# Patient Record
Sex: Female | Born: 1965
Health system: Southern US, Community
[De-identification: ages and names within clinical notes are randomized; demographics above are authoritative.]

## PROBLEM LIST (undated history)

## (undated) DIAGNOSIS — F32A Depression, unspecified: Secondary | ICD-10-CM

## (undated) DIAGNOSIS — Z800 Family history of malignant neoplasm of digestive organs: Secondary | ICD-10-CM

## (undated) DIAGNOSIS — F329 Major depressive disorder, single episode, unspecified: Secondary | ICD-10-CM

## (undated) DIAGNOSIS — D219 Benign neoplasm of connective and other soft tissue, unspecified: Secondary | ICD-10-CM

## (undated) DIAGNOSIS — D649 Anemia, unspecified: Secondary | ICD-10-CM

## (undated) DIAGNOSIS — Z803 Family history of malignant neoplasm of breast: Secondary | ICD-10-CM

## (undated) DIAGNOSIS — Z8041 Family history of malignant neoplasm of ovary: Secondary | ICD-10-CM

## (undated) DIAGNOSIS — R519 Headache, unspecified: Secondary | ICD-10-CM

## (undated) DIAGNOSIS — T7840XA Allergy, unspecified, initial encounter: Secondary | ICD-10-CM

## (undated) DIAGNOSIS — E079 Disorder of thyroid, unspecified: Secondary | ICD-10-CM

## (undated) DIAGNOSIS — R51 Headache: Secondary | ICD-10-CM

## (undated) HISTORY — DX: Family history of malignant neoplasm of breast: Z80.3

## (undated) HISTORY — DX: Major depressive disorder, single episode, unspecified: F32.9

## (undated) HISTORY — DX: Family history of malignant neoplasm of digestive organs: Z80.0

## (undated) HISTORY — PX: WISDOM TOOTH EXTRACTION: SHX21

## (undated) HISTORY — DX: Depression, unspecified: F32.A

## (undated) HISTORY — DX: Allergy, unspecified, initial encounter: T78.40XA

## (undated) HISTORY — DX: Anemia, unspecified: D64.9

## (undated) HISTORY — PX: MYOMECTOMY: SHX85

## (undated) HISTORY — DX: Family history of malignant neoplasm of ovary: Z80.41

---

## 1998-08-17 ENCOUNTER — Other Ambulatory Visit: Admission: RE | Admit: 1998-08-17 | Discharge: 1998-08-17 | Payer: Self-pay | Admitting: Obstetrics and Gynecology

## 1999-01-07 ENCOUNTER — Ambulatory Visit (HOSPITAL_COMMUNITY): Admission: RE | Admit: 1999-01-07 | Discharge: 1999-01-07 | Payer: Self-pay | Admitting: Obstetrics and Gynecology

## 1999-01-07 ENCOUNTER — Encounter (INDEPENDENT_AMBULATORY_CARE_PROVIDER_SITE_OTHER): Payer: Self-pay

## 1999-10-22 ENCOUNTER — Other Ambulatory Visit: Admission: RE | Admit: 1999-10-22 | Discharge: 1999-10-22 | Payer: Self-pay | Admitting: Obstetrics and Gynecology

## 2001-02-02 ENCOUNTER — Other Ambulatory Visit: Admission: RE | Admit: 2001-02-02 | Discharge: 2001-02-02 | Payer: Self-pay | Admitting: Obstetrics and Gynecology

## 2001-04-27 ENCOUNTER — Encounter: Admission: RE | Admit: 2001-04-27 | Discharge: 2001-04-27 | Payer: Self-pay | Admitting: Obstetrics and Gynecology

## 2001-04-27 ENCOUNTER — Encounter: Payer: Self-pay | Admitting: Obstetrics and Gynecology

## 2001-11-05 ENCOUNTER — Other Ambulatory Visit: Admission: RE | Admit: 2001-11-05 | Discharge: 2001-11-05 | Payer: Self-pay | Admitting: Obstetrics and Gynecology

## 2003-12-09 ENCOUNTER — Other Ambulatory Visit: Admission: RE | Admit: 2003-12-09 | Discharge: 2003-12-09 | Payer: Self-pay | Admitting: Obstetrics and Gynecology

## 2005-05-11 ENCOUNTER — Ambulatory Visit (HOSPITAL_COMMUNITY): Admission: RE | Admit: 2005-05-11 | Discharge: 2005-05-11 | Payer: Self-pay | Admitting: Obstetrics and Gynecology

## 2006-01-16 ENCOUNTER — Emergency Department (HOSPITAL_COMMUNITY): Admission: EM | Admit: 2006-01-16 | Discharge: 2006-01-16 | Payer: Self-pay | Admitting: Emergency Medicine

## 2006-08-28 ENCOUNTER — Ambulatory Visit (HOSPITAL_COMMUNITY): Admission: RE | Admit: 2006-08-28 | Discharge: 2006-08-28 | Payer: Self-pay | Admitting: Obstetrics and Gynecology

## 2006-11-22 IMAGING — CR DG CHEST 2V
2 series · 2 of 2 positions shown · non-contrast
Comparison: None.

CLINICAL DATA: Chest pain and headache.

[w chest pa]
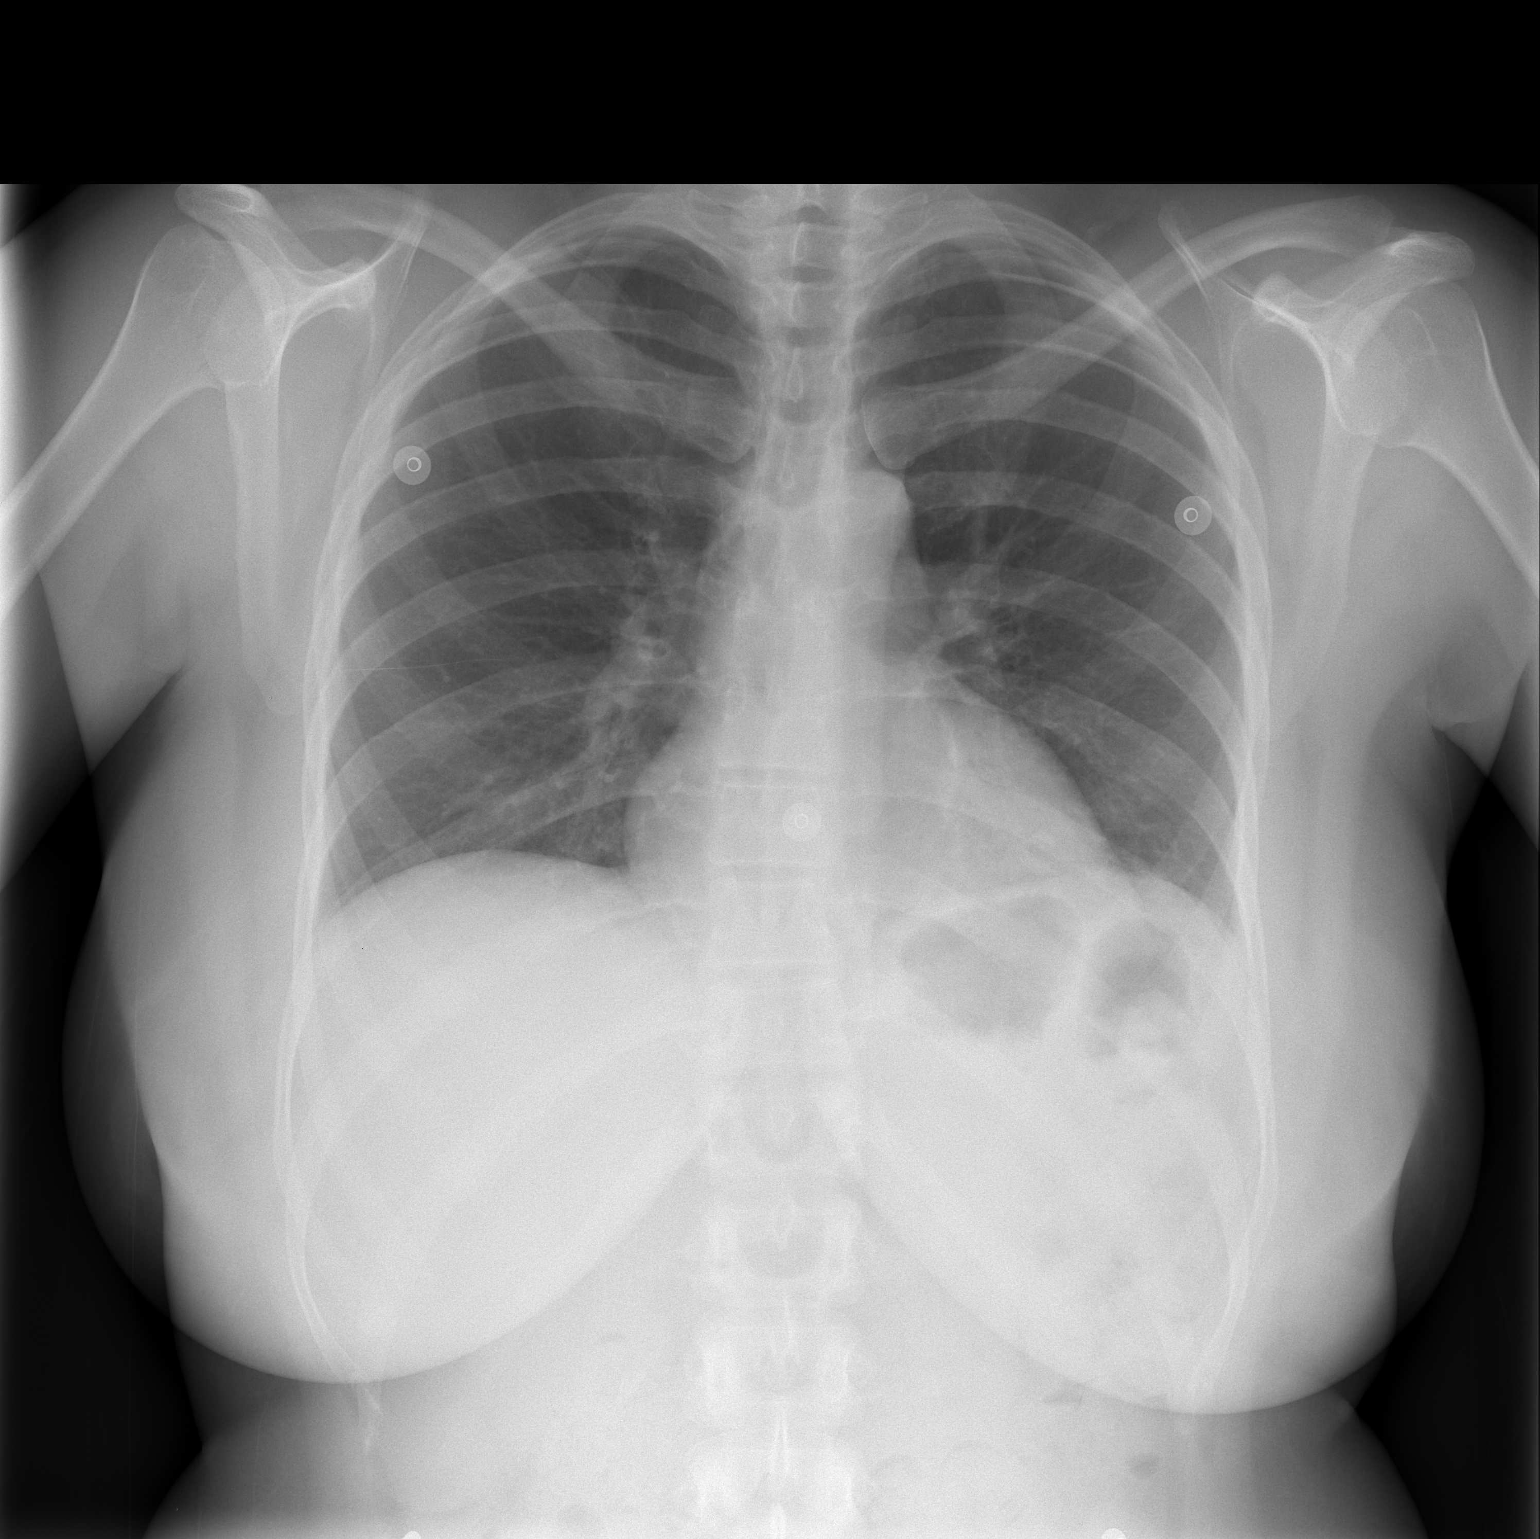

[w chest lat]
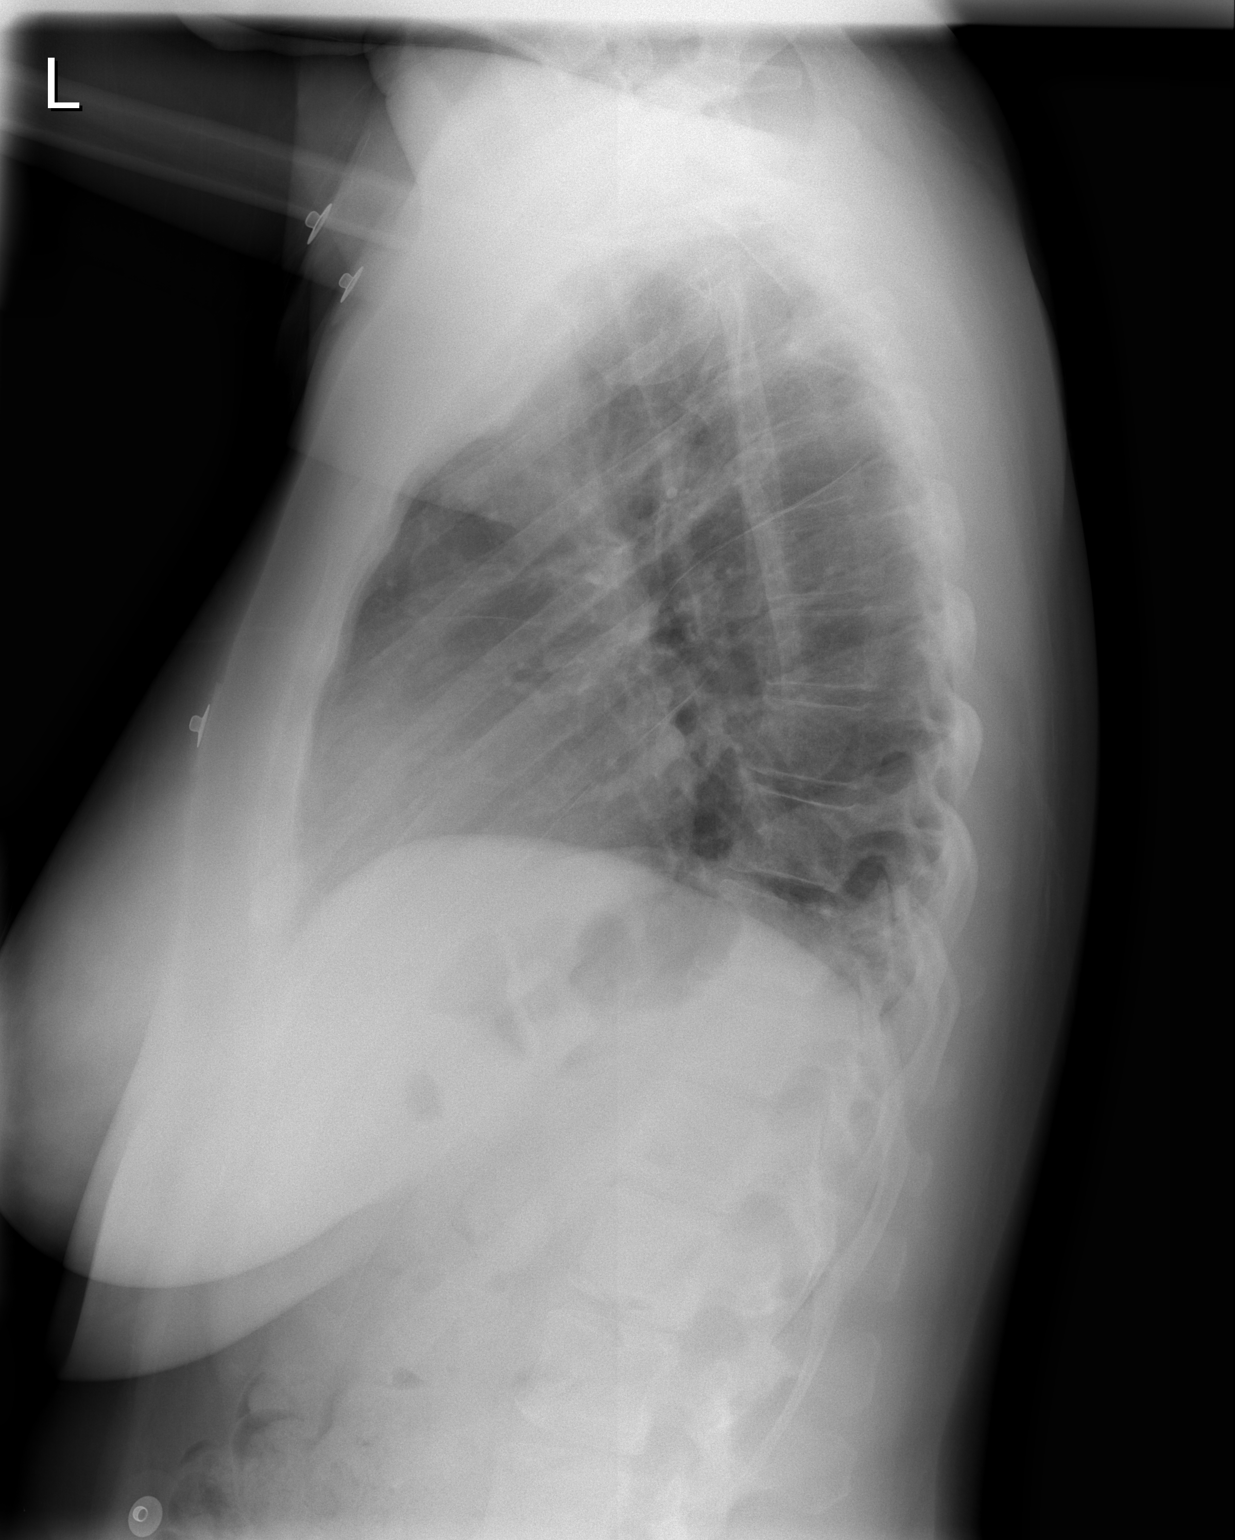

[2 of 2 positions shown; findings below may reference images not displayed]

CHEST - 2 VIEW:

Trace atelectasis at the left lung base. Right lung is clear. Heart size is
within normal limits. Bony structures of the visualized thorax are intact.
IMPRESSION: Trace atelectasis at the left lung base. An early infiltrate cannot be
completely excluded.

## 2007-01-17 ENCOUNTER — Inpatient Hospital Stay (HOSPITAL_COMMUNITY): Admission: RE | Admit: 2007-01-17 | Discharge: 2007-01-19 | Payer: Self-pay | Admitting: Obstetrics and Gynecology

## 2007-01-17 ENCOUNTER — Encounter (INDEPENDENT_AMBULATORY_CARE_PROVIDER_SITE_OTHER): Payer: Self-pay | Admitting: Obstetrics and Gynecology

## 2008-11-04 ENCOUNTER — Ambulatory Visit (HOSPITAL_COMMUNITY): Admission: RE | Admit: 2008-11-04 | Discharge: 2008-11-04 | Payer: Self-pay | Admitting: Obstetrics and Gynecology

## 2009-01-17 ENCOUNTER — Encounter: Admission: RE | Admit: 2009-01-17 | Discharge: 2009-01-17 | Payer: Self-pay | Admitting: Rheumatology

## 2009-08-27 ENCOUNTER — Ambulatory Visit: Payer: Self-pay | Admitting: Gastroenterology

## 2009-11-04 ENCOUNTER — Emergency Department (HOSPITAL_COMMUNITY): Admission: EM | Admit: 2009-11-04 | Discharge: 2009-11-04 | Payer: Self-pay | Admitting: Emergency Medicine

## 2010-01-12 ENCOUNTER — Ambulatory Visit (HOSPITAL_COMMUNITY): Admission: RE | Admit: 2010-01-12 | Discharge: 2010-01-12 | Payer: Self-pay | Admitting: Obstetrics and Gynecology

## 2010-01-14 ENCOUNTER — Encounter: Admission: RE | Admit: 2010-01-14 | Discharge: 2010-01-14 | Payer: Self-pay | Admitting: Obstetrics and Gynecology

## 2010-03-11 ENCOUNTER — Ambulatory Visit: Payer: Self-pay | Admitting: Family Medicine

## 2010-03-11 DIAGNOSIS — K219 Gastro-esophageal reflux disease without esophagitis: Secondary | ICD-10-CM | POA: Insufficient documentation

## 2010-03-11 DIAGNOSIS — F3289 Other specified depressive episodes: Secondary | ICD-10-CM | POA: Insufficient documentation

## 2010-03-11 DIAGNOSIS — F329 Major depressive disorder, single episode, unspecified: Secondary | ICD-10-CM | POA: Insufficient documentation

## 2010-03-11 DIAGNOSIS — F4321 Adjustment disorder with depressed mood: Secondary | ICD-10-CM | POA: Insufficient documentation

## 2010-03-11 DIAGNOSIS — E039 Hypothyroidism, unspecified: Secondary | ICD-10-CM | POA: Insufficient documentation

## 2010-03-11 DIAGNOSIS — G43909 Migraine, unspecified, not intractable, without status migrainosus: Secondary | ICD-10-CM | POA: Insufficient documentation

## 2010-03-11 DIAGNOSIS — J4520 Mild intermittent asthma, uncomplicated: Secondary | ICD-10-CM | POA: Insufficient documentation

## 2010-03-11 DIAGNOSIS — D649 Anemia, unspecified: Secondary | ICD-10-CM | POA: Insufficient documentation

## 2010-03-11 DIAGNOSIS — J45909 Unspecified asthma, uncomplicated: Secondary | ICD-10-CM | POA: Insufficient documentation

## 2010-07-12 ENCOUNTER — Encounter: Payer: Self-pay | Admitting: Rheumatology

## 2010-07-22 NOTE — Letter (Signed)
Summary: Patient Questionnaire  Patient Questionnaire   Imported By: Beau Fanny 03/11/2010 14:48:35  _____________________________________________________________________  External Attachment:    Type:   Image     Comment:   External Document

## 2010-07-22 NOTE — Assessment & Plan Note (Signed)
Summary: NEW PT TO EST/TICK BITE/CLE   Vital Signs:  Patient profile:   45 year old female Height:      66 inches Weight:      195.25 pounds BMI:     31.63 Temp:     98.1 degrees F oral Pulse rate:   76 / minute Pulse rhythm:   regular BP sitting:   100 / 72  (left arm) Cuff size:   large  Vitals Entered By: Lewanda Rife LPN (04/01/10 11:22 AM) CC: New pt to establish and tick removed from lt heel about one month ago, site still itches and is swollen   History of Present Illness: sees Dr Corine Shelter at [facility] ) in past -- and he is leaving ? but she will see him for thyroid problem until them was a holistic physician and MD  tx her for thyroid and edema issues   used to have anemia from fibroids - better after myomectomy  asthma started when she was 31  uses inhaler -- rescue when she needs it  pain will trigger it along with strong scents  also has seasonal allergies   depression -- due to hormone problems and stress and thyroid  low seretonin -- on thyroid med and progesterone now  was on antidep for 6 months - celexa (no longer on it )  saw a psychiatrist and a counselor in the past   GERD - occasionally -- takes prilosec occ - 1-2 per year  watches diet   migraine history  went to hosp 3-4 mo ago with one  has them a few times per year  usually takes excedrin migraine   has hypothyroidism  no hx of hyperthyroid - or treatment  no hx of goiter or thyroid cysts or nodules her mother has it     Dr Estanislado Pandy sees her gets regular periods with some cramps -myomectomy helped also endometriosis  last visit with Dr Estanislado Pandy last week  -- had dx of herpes  - oral and genital area -- flare ups tend to be aching instead of rash not on any med yet - considering valtrex  is not sexually active   needs Td needs flu shot   will due for labs in nov   had a tick bite 1 month ago (her mom had one too ) swelled and turned back -- still itches / little black  shape no target rash was a deer tick   other bite -- ? spider 2 mo ago  woke up with little blisters wrist and other hand in several areas / leg  they swelled up and then caved in  wondered about brown recluse bite  a little hypopigmentation at site on leg   Preventive Screening-Counseling & Management  Alcohol-Tobacco     Smoking Status: never  Caffeine-Diet-Exercise     Does Patient Exercise: yes      Drug Use:  no.    Allergies (verified): 1)  ! 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 Single Never Smoked Alcohol use-yes  -- 3 drinks a month  Drug use-no Regular exercise-yes- spinning classes   Risk Factors: Exercise: yes (04/01/2010)  Risk Factors: Smoking Status: never (01-Apr-2010)  Past Medical History: Anemia-NOS Asthma- mild  seasonal allergies- no meds  Depression GERD Migraines Thyroid problem HSV - labial and genital (possibly type 1 and 2)  fibrocystic breasts  IBS  hx of "colon infection"  Past Surgical History: Myomectomy for fibroids 2008 Laproscopy endometriosis  Family History: Father: deceased alcoholism/drug problem, copd  Mother: living arthiritis,breast cancer,stroke, hypothyroid  paternal grandmother:arthritis aunt: breast cancer mother is Medical illustrator  Social History: Medical sales representative- works for [employer]  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 Eyes:  Denies blurring and eye irritation. CV:  Denies chest pain or discomfort, lightheadness, and palpitations. Resp:  Denies cough, pleuritic, shortness of breath, and wheezing; occ pain  at L lower rib when she takes a deep breath . GI:  Denies abdominal pain, change in bowel habits, indigestion, and nausea. GU:  Denies discharge and dysuria. MS:  Complains of joint pain; denies joint swelling; R hip hurts sometimes in the groin area . Derm:  Complains of insect bite(s); denies itching, lesion(s), poor wound healing, and rash. Neuro:  Complains of headaches; denies numbness, sensation of room spinning, and visual disturbances. Psych:  Denies anxiety, irritability, and panic attacks. Endo:  Denies cold intolerance, excessive thirst, excessive urination, and heat intolerance. Heme:  Denies abnormal bruising and bleeding.  Physical Exam  General:  overweight but generally well appearing  Head:  normocephalic, atraumatic, and no abnormalities observed.   Eyes:  vision grossly intact, pupils equal, pupils round, and pupils reactive to light.  no conjunctival pallor, injection or icterus  Nose:  no nasal discharge.   Mouth:  pharynx pink and moist.   Neck:  supple with full rom and no masses or thyromegally, no JVD or carotid bruit  Chest Wall:  No deformities, masses, or tenderness noted. Lungs:  Normal respiratory effort, chest expands symmetrically. Lungs are clear to auscultation, no crackles or wheezes. Heart:  Normal rate and regular rhythm. S1 and S2 normal without gallop, murmur, click, rub or other extra sounds. Abdomen:  Bowel sounds positive,abdomen soft and non-tender without masses, organomegaly or hernias noted. Msk:  some pain on ext rot of L hip without creptice nl slr and gait  no acute joint changes  Pulses:  R and L carotid,radial,femoral,dorsalis  pedis and posterior tibial pulses are full and equal bilaterally Extremities:  No clubbing, cyanosis, edema, or deformity noted with normal full range of motion of all joints.   Neurologic:  sensation intact to light touch, gait normal, and DTRs symmetrical and normal.   Skin:  Intact without suspicious lesions or rashes Cervical Nodes:  No lymphadenopathy noted Inguinal Nodes:  No significant adenopathy Psych:  normal affect, talkative and pleasant    Impression & Recommendations:  Problem # 1:  UNSPECIFIED HYPOTHYROIDISM (ICD-244.9) Assessment New will continue to see her specialist/ holistic dr for this and progesterone until he retires/ leaves she is feeling about the same will have labs in nov and foward them to me  Her updated medication list for this problem includes:    Armour Thyroid 30 Mg Tabs (Thyroid) .Marland Kitchen... Take 1 tablet by mouth once a day  Problem # 2:  Preventive Health Care (ICD-V70.0) Assessment: Comment Only Td  today will get flu shot at work  Problem # 3:  TICK BITE (ICD-E906.4) Assessment: New 1 mo ago- no rel symptoms small hyperpig area on L ankle remains  will continue to watch for rash / joint pain or other symptoms  Complete Medication List: 1)  Armour Thyroid 30 Mg Tabs (Thyroid) .... Take 1 tablet by mouth once a day 2)  Fish Oil Oil (Fish oil) .... Otc as directed. 3)  Progesterone ?mg  .... Two at bedtime 4)  Ventolin Hfa 108 (90 Base) Mcg/act Aers (Albuterol sulfate) .... 2 puffs up to every 4 hours as needed  Other Orders: TD Toxoids IM 7 YR + (16109) Admin 1st Vaccine (60454)  Patient Instructions: 1)  if hip and rib pain do not improve with heat and stretching in 1 month let me know  2)  please send copy of herbs  3)  tetnus shot today 4)  ger your flu shot at work 5)  please send me a copy of labs from november 6)  update me if rash/ joint pain - signs of tick fever   Current Allergies (reviewed today): ! CODEINE   Preventive Care  Screening     colonoscopy in 2008- in [place]    Immunizations Administered:  Tetanus Vaccine:    Vaccine Type: Td    Site: left deltoid    Mfr: Sanofi Pasteur    Dose: 0.5 ml    Route: IM    Given by: Lewanda Rife LPN    Exp. Date: 07/22/2011    Lot #: [id number]    VIS given: 05/07/08 version given March 11, 2010.  Pt decided she can get the flu shot at work so she will wait on the flu shot today.Lewanda Rife LPN  March 11, 2010 1:03 PM

## 2010-09-06 LAB — URINALYSIS, ROUTINE W REFLEX MICROSCOPIC
Ketones, ur: NEGATIVE mg/dL
Leukocytes, UA: NEGATIVE
Nitrite: NEGATIVE
Urobilinogen, UA: 0.2 mg/dL (ref 0.0–1.0)

## 2010-09-06 LAB — POCT PREGNANCY, URINE: Preg Test, Ur: NEGATIVE

## 2010-09-06 LAB — DIFFERENTIAL
Basophils Absolute: 0 10*3/uL (ref 0.0–0.1)
Eosinophils Absolute: 0.1 10*3/uL (ref 0.0–0.7)
Lymphocytes Relative: 31 % (ref 12–46)
Neutro Abs: 3.8 10*3/uL (ref 1.7–7.7)

## 2010-09-06 LAB — CBC
Hemoglobin: 13.8 g/dL (ref 12.0–15.0)
MCHC: 33.9 g/dL (ref 30.0–36.0)
MCV: 90.8 fL (ref 78.0–100.0)
RBC: 4.48 MIL/uL (ref 3.87–5.11)
RDW: 13.4 % (ref 11.5–15.5)
WBC: 6.2 10*3/uL (ref 4.0–10.5)

## 2010-09-06 LAB — URINE MICROSCOPIC-ADD ON

## 2010-09-06 LAB — POCT I-STAT, CHEM 8
BUN: 10 mg/dL (ref 6–23)
Glucose, Bld: 77 mg/dL (ref 70–99)
Hemoglobin: 15 g/dL (ref 12.0–15.0)

## 2010-09-15 ENCOUNTER — Encounter: Payer: Self-pay | Admitting: Family Medicine

## 2010-09-16 ENCOUNTER — Encounter: Payer: Self-pay | Admitting: Family Medicine

## 2010-11-02 NOTE — Op Note (Signed)
[name], [name]               ACCOUNT NO.:  [id number]   MEDICAL RECORD NO.:  [id number]          PATIENT TYPE:  INP   LOCATION:  9305                          FACILITY:  [facility]   PHYSICIAN:  Maxie Better, M.D.DATE OF BIRTH:  1965/12/16   DATE OF PROCEDURE:  01/17/2007  DATE OF DISCHARGE:                               OPERATIVE REPORT   PREOPERATIVE DIAGNOSIS:  Menorrhagia, uterine fibroids.   PROCEDURE:  Exploratory laparotomy, multiple myomectomy.   POSTOPERATIVE DIAGNOSIS:  Menorrhagia, uterine fibroids.   ANESTHESIA:  General.   SURGEON:  Maxie Better, M.D.   ASSISTANT SURGEON:  Sherry A. Rosalio Macadamia, M.D.   PROCEDURE:  Under adequate general anesthesia, the patient is placed in  the supine position.  She was sterilely prepped and draped in usual  fashion.  Indwelling Foley catheter was sterilely placed.  Examination  under anesthesia had revealed about 12-week size uterus.  No adnexal  masses could be appreciated.  A Pfannenstiel skin incision was made.  Quarter percent Marcaine was injected prior to the incision.  This  incision was carried down to the rectus fascia.  Rectus fascia was  opened transversely.  The rectus fascia was then bluntly and sharply  dissected off the rectus muscle in superior and inferior fashion.  The  rectus muscles split in midline.  The parietal peritoneum was entered  sharply and extended.  The uterus was noted to have multiple fibroids.  In particular there was a prominent subserosal fibroid posteriorly and  multiple anterior fibroids, two of whom were at the fundal area medial  to the fallopian tube insertions bilaterally.  Anteriorly there was  several subserosal fibroids above the bladder reflection, a Balfour  retractor was placed.  The bowels were packed upwardly.  Normal tubes  and ovaries were noted bilaterally.  In the posterior cul-de-sac deep in  the pelvis there were two small endometriotic implants seen.  A dilute  solution of Pitressin was injected overlying fibroids and the  enucleation of the fibroids were then made starting on the fundus and  anteriorly.  Posteriorly a vertical posterior incision had to be made  and through that incision several fibroids were removed and in  particular the endometrial cavity was there at that point entered and  exploration with the digital finger the multiple fibroids were noted in  the endometrial cavity some of which was pedunculated.  Others were  submucosal.  These removed anteriorly through the anterior incision that  had been made where several fibroids were removed.  A degenerating  fibroid was found deep which had been transversing not only the  intramural area but extending to the submucosal area.  This was  carefully dissected all of which were close to the insertion of the  fallopian tubes care was taken not to involve those organs.  When all  palpable fibroids had been removed both anteriorly and posteriorly,  these incisions were then closed with 0-0 Vicryl figure-of-eight sutures  to serosal surface which was then closed with 4-0 Monocryl sutures in a  baseball stitch.  Several reinforcing sutures were placed for good  hemostasis.  The  uterus was then irrigated, the pelvis irrigated and  suctioned of debris.  The incisions were then covered with Interceed  anteriorly and posteriorly.  No other findings were noted.  Good  hemostasis was noted.  The parietal peritoneum was not closed.  The  packings were removed.  The retractors were removed and the rectus  fascia was then closed with 0-0 Vicryl x2.  The subcutaneous area was  irrigated, small bleeders cauterized.  Interrupted 2-0 plain sutures and  skin approximated using Ethicon staples.  Specimen was myoma x 15.  Estimated blood loss was 150 mL.  Sponge and instrument counts x2 was  correct.  The intraoperative fluid was about 1500 mL crystalloid.  Urine  output was 200 mL clear yellow urine.  The  endometrial cavity had been  entered and therefore this patient will need cesarean section for future  deliveries.   COMPLICATIONS:  None.   The patient tolerated the procedure well, was transferred to recovery in  stable condition.      Maxie Better, M.D.  Electronically Signed     [place]/MEDQ  D:  01/17/2007  T:  01/18/2007  Job:  [id number]

## 2010-11-05 NOTE — Discharge Summary (Signed)
[name], [name]               ACCOUNT NO.:  [id number]   MEDICAL RECORD NO.:  [id number]          PATIENT TYPE:  INP   LOCATION:  9305                          FACILITY:  [facility]   PHYSICIAN:  Maxie Better, M.D.DATE OF BIRTH:  09/01/1965   DATE OF ADMISSION:  01/17/2007  DATE OF DISCHARGE:  01/19/2007                               DISCHARGE SUMMARY   ADMISSION DIAGNOSES:  Menorrhagia, uterine fibroids, iron deficiency  anemia.   DISCHARGE DIAGNOSES:  Menorrhagia, uterine fibroids, iron-deficiency  anemia.   PROCEDURE:  Exploratory laparotomy, multiple myomectomy.   HOSPITAL COURSE:  The patient was admitted to [facility] and she  underwent an exploratory laparotomy and myomectomy. Normal tubes and  ovaries were noted at the time. Endometrial cavity was entered which  will necessitate Cesarean sections in the future.  Pathology was  consistent with 15 leiomyomas one of which was infarcted  clinically.  The patient had slow postoperative course due to her bowels with the  patient subsequently passing gas later in the evening of postop day #2.   Her CBC on postop day #1 showed a hemoglobin of 9.6, hematocrit of 29.6,  white count 11.7, platelet count of greater than 319,000.  The preop  hemoglobin had been 10.9.  After the patient passed flatus, had been  tolerating a regular diet, had remained afebrile and incision showed no  evidence of infection, she was deemed well to be discharged home.   DISPOSITION:  Home.   CONDITION ON DISCHARGE:  Stable.   DISCHARGE MEDICATIONS:  1. Motrin 800 mg every 6 h p.r.n., pain.  2. Tylox 1 to 2 tablets every 3 to 4 h p.r.n., pain.   DISCHARGE INSTRUCTIONS:  Call for temperature greater than or equal to  100.4.  Nothing per vagina for 4 to 6 weeks. No heavy lifting or driving  for 2 weeks. Call for increased incisional pain, drainage, redness at  the incision site. Call for severe abdominal pain, nausea, vomiting.  Follow up at  [facility] at 4 to 6 weeks and for staple removal in  the office on Monday or Tuesday.      Maxie Better, M.D.  Electronically Signed     [place]/MEDQ  D:  02/25/2007  T:  02/25/2007  Job:  [id number]

## 2011-04-04 LAB — CBC
HCT: 29.6 — ABNORMAL LOW
Hemoglobin: 10.9 — ABNORMAL LOW
MCV: 85.7
Platelets: 319
Platelets: 371
RDW: 18.4 — ABNORMAL HIGH
RDW: 19.2 — ABNORMAL HIGH
WBC: 11.7 — ABNORMAL HIGH

## 2011-04-04 LAB — HCG, SERUM, QUALITATIVE: Preg, Serum: NEGATIVE

## 2011-05-17 ENCOUNTER — Other Ambulatory Visit: Payer: Self-pay | Admitting: Physician Assistant

## 2011-05-17 DIAGNOSIS — Z1231 Encounter for screening mammogram for malignant neoplasm of breast: Secondary | ICD-10-CM

## 2011-06-23 ENCOUNTER — Ambulatory Visit (HOSPITAL_COMMUNITY): Payer: Self-pay

## 2011-07-21 ENCOUNTER — Ambulatory Visit (HOSPITAL_COMMUNITY)
Admission: RE | Admit: 2011-07-21 | Discharge: 2011-07-21 | Disposition: A | Discharge status: Home / Self Care | Payer: Federal, State, Local not specified - PPO | Source: Ambulatory Visit | Attending: Physician Assistant | Admitting: Physician Assistant

## 2011-07-21 DIAGNOSIS — Z1231 Encounter for screening mammogram for malignant neoplasm of breast: Secondary | ICD-10-CM

## 2011-09-14 ENCOUNTER — Other Ambulatory Visit: Payer: Self-pay | Admitting: Nurse Practitioner

## 2011-09-20 ENCOUNTER — Other Ambulatory Visit: Payer: Federal, State, Local not specified - PPO

## 2011-09-21 ENCOUNTER — Other Ambulatory Visit: Payer: Federal, State, Local not specified - PPO

## 2011-09-27 ENCOUNTER — Ambulatory Visit
Admission: RE | Admit: 2011-09-27 | Discharge: 2011-09-27 | Disposition: A | Discharge status: Home / Self Care | Payer: Federal, State, Local not specified - PPO | Source: Ambulatory Visit | Attending: Nurse Practitioner | Admitting: Nurse Practitioner

## 2011-12-21 ENCOUNTER — Other Ambulatory Visit: Payer: Self-pay | Admitting: Obstetrics and Gynecology

## 2012-03-14 ENCOUNTER — Encounter (HOSPITAL_BASED_OUTPATIENT_CLINIC_OR_DEPARTMENT_OTHER): Payer: Self-pay

## 2012-03-14 ENCOUNTER — Emergency Department (HOSPITAL_BASED_OUTPATIENT_CLINIC_OR_DEPARTMENT_OTHER): Payer: Federal, State, Local not specified - PPO

## 2012-03-14 ENCOUNTER — Emergency Department (HOSPITAL_BASED_OUTPATIENT_CLINIC_OR_DEPARTMENT_OTHER)
Admission: EM | Admit: 2012-03-14 | Discharge: 2012-03-15 | Disposition: A | Discharge status: Home / Self Care | Payer: Federal, State, Local not specified - PPO | Attending: Emergency Medicine | Admitting: Emergency Medicine

## 2012-03-14 DIAGNOSIS — D259 Leiomyoma of uterus, unspecified: Secondary | ICD-10-CM | POA: Insufficient documentation

## 2012-03-14 DIAGNOSIS — E079 Disorder of thyroid, unspecified: Secondary | ICD-10-CM | POA: Insufficient documentation

## 2012-03-14 DIAGNOSIS — N83209 Unspecified ovarian cyst, unspecified side: Secondary | ICD-10-CM

## 2012-03-14 HISTORY — DX: Disorder of thyroid, unspecified: E07.9

## 2012-03-14 LAB — URINALYSIS, ROUTINE W REFLEX MICROSCOPIC
Ketones, ur: 15 mg/dL — AB
Protein, ur: NEGATIVE mg/dL
Urobilinogen, UA: 0.2 mg/dL (ref 0.0–1.0)
pH: 6.5 (ref 5.0–8.0)

## 2012-03-14 LAB — URINE MICROSCOPIC-ADD ON

## 2012-03-14 NOTE — ED Notes (Signed)
Patient here with LLQ pain since 4pm with vomiting. Denies diarrhea....denies urinary symptoms

## 2012-03-14 NOTE — ED Notes (Signed)
MD at bedside. 

## 2012-03-15 MED ORDER — IBUPROFEN 800 MG PO TABS
800.0000 mg | ORAL_TABLET | Freq: Once | ORAL | Status: AC
  Administered 2012-03-15: 800 mg via ORAL
  Filled 2012-03-15: qty 1

## 2012-03-15 MED ORDER — HYDROCODONE-ACETAMINOPHEN 5-500 MG PO TABS: 1.0000 | ORAL_TABLET | Freq: Four times a day (QID) | ORAL | Status: DC | PRN

## 2012-03-15 MED ORDER — IBUPROFEN 600 MG PO TABS: 600.0000 mg | ORAL_TABLET | Freq: Four times a day (QID) | ORAL | Status: DC | PRN

## 2012-03-15 NOTE — ED Provider Notes (Signed)
History     CSN: [id number]  Arrival date & time 03/14/12  2228   First MD Initiated Contact with Patient 03/14/12 2251      Chief Complaint  Patient presents with  . Abdominal Pain    (Consider location/radiation/quality/duration/timing/severity/associated sxs/prior treatment) HPI Hx per PT. This afternoon started having L sided pelvic pain, has h/o uterine fibroids requiring myomectomy in the past. No F/C, no vag bleeding or discharge. Pain is constant. No known aggrevating or alleviating factors. Sharp in quality and not radiating. Moderate in severity, declines anything stronger than motrin. Feels similar to uterine fibroid pain that she had in the past. GYN is in [facility]. Past Medical History  Diagnosis Date  . Thyroid disease     History reviewed. No pertinent past surgical history.  No family history on file.  History  Substance Use Topics  . Smoking status: Never Smoker   . Smokeless tobacco: Not on file  . Alcohol Use:     OB History    Grav Para Term Preterm Abortions TAB SAB Ect Mult Living                  Review of Systems  Constitutional: Negative for fever and chills.  HENT: Negative for neck pain and neck stiffness.   Eyes: Negative for pain.  Respiratory: Negative for shortness of breath.   Cardiovascular: Negative for chest pain.  Gastrointestinal: Negative for abdominal pain.  Genitourinary: Positive for pelvic pain. Negative for dysuria, urgency, frequency, hematuria, vaginal bleeding and vaginal discharge.  Musculoskeletal: Negative for back pain.  Skin: Negative for rash.  Neurological: Negative for headaches.  All other systems reviewed and are negative.    Allergies  Codeine  Home Medications   Current Outpatient Rx  Name Route Sig Dispense Refill  . THYROID 60 MG PO TABS Oral Take 60 mg by mouth daily.      BP 128/78  Pulse 73  Temp 98.5 F (36.9 C) (Oral)  Resp 18  SpO2 98%  LMP 03/02/2012  Physical Exam  Nursing  note and vitals reviewed. Constitutional: She is oriented to person, place, and time. She appears well-developed and well-nourished.  HENT:  Head: Normocephalic and atraumatic.  Eyes: EOM are normal. Pupils are equal, round, and reactive to light. No scleral icterus.  Neck: Neck supple.  Cardiovascular: Normal heart sounds and intact distal pulses.   Pulmonary/Chest: Effort normal. No respiratory distress.  Abdominal: Soft. Bowel sounds are normal. She exhibits no distension and no mass. There is no rebound and no guarding.       TTP left lower pelvic without peritonitis, no tenderness otherwise.   Musculoskeletal: Normal range of motion. She exhibits no edema.  Neurological: She is alert and oriented to person, place, and time.  Skin: Skin is warm and dry.    ED Course  Procedures (including critical care time)  Results for orders placed during the hospital encounter of 03/14/12  URINALYSIS, ROUTINE W REFLEX MICROSCOPIC      Component Value Range   Color, Urine YELLOW  YELLOW   APPearance CLOUDY (*) CLEAR   Specific Gravity, Urine 1.017  1.005 - 1.030   pH 6.5  5.0 - 8.0   Glucose, UA NEGATIVE  NEGATIVE mg/dL   Hgb urine dipstick NEGATIVE  NEGATIVE   Bilirubin Urine NEGATIVE  NEGATIVE   Ketones, ur 15 (*) NEGATIVE mg/dL   Protein, ur NEGATIVE  NEGATIVE mg/dL   Urobilinogen, UA 0.2  0.0 - 1.0 mg/dL  Nitrite NEGATIVE  NEGATIVE   Leukocytes, UA SMALL (*) NEGATIVE  URINE MICROSCOPIC-ADD ON      Component Value Range   Squamous Epithelial / LPF MANY (*) RARE   WBC, UA 7-10  <3 WBC/hpf   RBC / HPF 0-2  <3 RBC/hpf   Bacteria, UA MANY (*) RARE   US Transvaginal Non-ob  03/15/2012  *RADIOLOGY REPORT*  Clinical Data: Left lower quadrant pain.  Nausea and vomiting. History of fibroids.  TRANSABDOMINAL AND TRANSVAGINAL ULTRASOUND OF PELVIS Technique:  Both transabdominal and transvaginal ultrasound examinations of the pelvis were performed. Transabdominal technique was performed for  global imaging of the pelvis including uterus, ovaries, adnexal regions, and pelvic cul-de-sac.  It was necessary to proceed with endovaginal exam following the transabdominal exam to visualize the uterus and adnexa.  Comparison:  None  Findings:  Uterus: Anterior fundal fibroid is present.  Overall uterine measurements are 7.9 cm x 4.9 cm x 5.5 cm. The anterior fundal fibroid measures 41 mm x 25 mm x 39 mm.  Majority of the myometrial echotexture is within normal limits.  Endometrium: 8 mm, normal.  Right ovary:  37 mm x 24 mm x 23 mm with normal echotexture.  Left ovary: 62 mm x 47 mm x 15 mm.  There is a solid appearing left adnexal lesion (3.8 x 3.4 x 3.4 cm) without intravascular flow, most compatible with a hemorrhagic cyst.  Slightly increased through transmission.  Small areas of decreased echogenicity are present centrally.  Normal color flow to the surrounding ovarian tissue.  Other findings: Physiologic trace free fluid.  IMPRESSION: 1.  Fibroid uterus.  Largest anterior fundal fibroid measures 41 mm long axis. 2.  Echogenic lesion in the left adnexa, likely intraovarian.  No internal vascular flow.  This probably represents acute or subacute hemorrhage into a cyst based on the lack of internal vascular flow. 6-week follow-up pelvic ultrasound is recommended to reassess.   Original Report Authenticated By: Andreas Newport, M.D.    US Pelvis Complete  03/15/2012  *RADIOLOGY REPORT*  Clinical Data: Left lower quadrant pain.  Nausea and vomiting. History of fibroids.  TRANSABDOMINAL AND TRANSVAGINAL ULTRASOUND OF PELVIS Technique:  Both transabdominal and transvaginal ultrasound examinations of the pelvis were performed. Transabdominal technique was performed for global imaging of the pelvis including uterus, ovaries, adnexal regions, and pelvic cul-de-sac.  It was necessary to proceed with endovaginal exam following the transabdominal exam to visualize the uterus and adnexa.  Comparison:  None  Findings:   Uterus: Anterior fundal fibroid is present.  Overall uterine measurements are 7.9 cm x 4.9 cm x 5.5 cm. The anterior fundal fibroid measures 41 mm x 25 mm x 39 mm.  Majority of the myometrial echotexture is within normal limits.  Endometrium: 8 mm, normal.  Right ovary:  37 mm x 24 mm x 23 mm with normal echotexture.  Left ovary: 62 mm x 47 mm x 15 mm.  There is a solid appearing left adnexal lesion (3.8 x 3.4 x 3.4 cm) without intravascular flow, most compatible with a hemorrhagic cyst.  Slightly increased through transmission.  Small areas of decreased echogenicity are present centrally.  Normal color flow to the surrounding ovarian tissue.  Other findings: Physiologic trace free fluid.  IMPRESSION: 1.  Fibroid uterus.  Largest anterior fundal fibroid measures 41 mm long axis. 2.  Echogenic lesion in the left adnexa, likely intraovarian.  No internal vascular flow.  This probably represents acute or subacute hemorrhage into a cyst based on  the lack of internal vascular flow. 6-week follow-up pelvic ultrasound is recommended to reassess.   Original Report Authenticated By: Andreas Newport, M.D.    Fibroids and ovarian cyst by [place].  UA reviewed, doubt UTI, no dysuria and contaminated sample. U CX pending  Rx pain meds provided with ABD pain/ pelvic pain precautions verbalized as understood. Local GYN referral provided.   MDM   Adult female with pelvic pain and large fibroids on Ultrasound. Motrin provided. UA obtained/ reviewed. VS and nursing notes reviewed.         Sunnie Nielsen, MD 03/15/12 [phone]

## 2012-03-15 NOTE — ED Notes (Signed)
Pt returned from US

## 2012-03-16 LAB — URINE CULTURE
Colony Count: NO GROWTH
Culture: NO GROWTH

## 2012-06-11 ENCOUNTER — Emergency Department (HOSPITAL_BASED_OUTPATIENT_CLINIC_OR_DEPARTMENT_OTHER)
Admission: EM | Admit: 2012-06-11 | Discharge: 2012-06-11 | Disposition: A | Discharge status: Home / Self Care | Payer: Federal, State, Local not specified - PPO | Attending: Emergency Medicine | Admitting: Emergency Medicine

## 2012-06-11 ENCOUNTER — Encounter (HOSPITAL_BASED_OUTPATIENT_CLINIC_OR_DEPARTMENT_OTHER): Payer: Self-pay | Admitting: *Deleted

## 2012-06-11 DIAGNOSIS — B349 Viral infection, unspecified: Secondary | ICD-10-CM

## 2012-06-11 DIAGNOSIS — R110 Nausea: Secondary | ICD-10-CM | POA: Insufficient documentation

## 2012-06-11 DIAGNOSIS — J3489 Other specified disorders of nose and nasal sinuses: Secondary | ICD-10-CM | POA: Insufficient documentation

## 2012-06-11 DIAGNOSIS — Z9109 Other allergy status, other than to drugs and biological substances: Secondary | ICD-10-CM | POA: Insufficient documentation

## 2012-06-11 DIAGNOSIS — H53149 Visual discomfort, unspecified: Secondary | ICD-10-CM | POA: Insufficient documentation

## 2012-06-11 DIAGNOSIS — E079 Disorder of thyroid, unspecified: Secondary | ICD-10-CM | POA: Insufficient documentation

## 2012-06-11 DIAGNOSIS — Z79899 Other long term (current) drug therapy: Secondary | ICD-10-CM | POA: Insufficient documentation

## 2012-06-11 DIAGNOSIS — R0602 Shortness of breath: Secondary | ICD-10-CM | POA: Insufficient documentation

## 2012-06-11 DIAGNOSIS — B9789 Other viral agents as the cause of diseases classified elsewhere: Secondary | ICD-10-CM | POA: Insufficient documentation

## 2012-06-11 DIAGNOSIS — R197 Diarrhea, unspecified: Secondary | ICD-10-CM | POA: Insufficient documentation

## 2012-06-11 DIAGNOSIS — Z8659 Personal history of other mental and behavioral disorders: Secondary | ICD-10-CM | POA: Insufficient documentation

## 2012-06-11 DIAGNOSIS — G43909 Migraine, unspecified, not intractable, without status migrainosus: Secondary | ICD-10-CM | POA: Insufficient documentation

## 2012-06-11 DIAGNOSIS — R42 Dizziness and giddiness: Secondary | ICD-10-CM | POA: Insufficient documentation

## 2012-06-11 DIAGNOSIS — R0981 Nasal congestion: Secondary | ICD-10-CM

## 2012-06-11 DIAGNOSIS — R030 Elevated blood-pressure reading, without diagnosis of hypertension: Secondary | ICD-10-CM | POA: Insufficient documentation

## 2012-06-11 MED ORDER — DIPHENHYDRAMINE HCL 25 MG PO CAPS
25.0000 mg | ORAL_CAPSULE | Freq: Once | ORAL | Status: AC
  Administered 2012-06-11: 25 mg via ORAL
  Filled 2012-06-11: qty 1

## 2012-06-11 MED ORDER — IBUPROFEN 800 MG PO TABS
800.0000 mg | ORAL_TABLET | Freq: Once | ORAL | Status: AC
  Administered 2012-06-11: 800 mg via ORAL
  Filled 2012-06-11: qty 1

## 2012-06-11 MED ORDER — METOCLOPRAMIDE HCL 10 MG PO TABS
10.0000 mg | ORAL_TABLET | Freq: Once | ORAL | Status: AC
  Administered 2012-06-11: 10 mg via ORAL
  Filled 2012-06-11: qty 1

## 2012-06-11 MED ORDER — PSEUDOEPHEDRINE HCL 30 MG PO TABS: 30.0000 mg | ORAL_TABLET | ORAL | Status: DC | PRN

## 2012-06-11 MED ORDER — DIPHENHYDRAMINE HCL 25 MG PO CAPS: 25.0000 mg | ORAL_CAPSULE | Freq: Four times a day (QID) | ORAL | Status: DC | PRN

## 2012-06-11 MED ORDER — OXYMETAZOLINE HCL 0.05 % NA SOLN
1.0000 | Freq: Two times a day (BID) | NASAL | Status: DC | PRN
  Administered 2012-06-11: 1 via NASAL
  Filled 2012-06-11: qty 15

## 2012-06-11 MED ORDER — PROCHLORPERAZINE MALEATE 10 MG PO TABS: 10.0000 mg | ORAL_TABLET | Freq: Two times a day (BID) | ORAL | Status: DC | PRN

## 2012-06-11 NOTE — ED Notes (Signed)
Pt c/o dizziness x 1 month and h/a x 2 days

## 2012-06-11 NOTE — ED Provider Notes (Signed)
History   This chart was scribed for Miranda Skene, MD by [name], ED Scribe. The patient was seen in room MH03/MH03 and the patient's care was started at 7:47PM.    CSN: [id number]  Arrival date & time 06/11/12  1859   First MD Initiated Contact with Patient 06/11/12 1947      Chief Complaint  Patient presents with  . Dizziness  . Hypertension    (Consider location/radiation/quality/duration/timing/severity/associated sxs/prior treatment) The history is provided by the patient. No language interpreter was used.    [name] is a 46 y.o. female , with a hx of anxiety, migraine, seasonal allergies, and viral infection (diagnosed 2 weeks ago, accompanied by diarrhea), who presents to the Emergency Department complaining of sudden, progressively worsening, headache, located at the left temple region behind the left eye, onset three days ago (06/08/12).  Associated symptoms include nasal congestion, shortness of breath, dizziness, nausea, and photophobia. The pt informs she was at the Pharmacy earlier this afternoon, where she had her blood pressure measured, resulting in 142/96. The high reading prompted the pt's concerning and desire to receive medial evaluation at [facility] this evening. The pt reports she has been experiencing an intermittent, throbbing, headache since Friday, 06/08/12. The pt rates her headache at a 8/10 at present. The pt has not taken any medications to relieve her headache symptoms at this point in time.   The pt denies chest pain and hemoptysis. Furthermore, the pt denies any hx of blood clots or prolonged periods of travel nor remaining sedentary in bed recently.   The pt does not smoke or drink alcohol.   PCP is Dr. Milinda Antis.    Past Medical History  Diagnosis Date  . Thyroid disease     History reviewed. No pertinent past surgical history.  History reviewed. No pertinent family history.  History  Substance Use Topics  . Smoking status: Never Smoker    . Smokeless tobacco: Not on file  . Alcohol Use: No    OB History    Grav Para Term Preterm Abortions TAB SAB Ect Mult Living                  Review of Systems  At least 10pt or greater review of systems completed and are negative except where specified in the HPI.   Allergies  Codeine  Home Medications   Current Outpatient Rx  Name  Route  Sig  Dispense  Refill  . HYDROCODONE-ACETAMINOPHEN 5-500 MG PO TABS   Oral   Take 1 tablet by mouth every 6 (six) hours as needed for pain (no driving while medicated).   10 tablet   0   . IBUPROFEN 600 MG PO TABS   Oral   Take 1 tablet (600 mg total) by mouth every 6 (six) hours as needed for pain.   30 tablet   0   . THYROID 60 MG PO TABS   Oral   Take 60 mg by mouth daily.           BP 164/83  Pulse 61  Temp 97.7 F (36.5 C) (Oral)  Resp 16  Ht 5\' 5"  (1.651 m)  Wt 179 lb (81.194 kg)  BMI 29.79 kg/m2  SpO2 100%  LMP 06/09/2012  Physical Exam  Nursing notes reviewed.  Electronic medical record reviewed. VITAL SIGNS:   Filed Vitals:   06/11/12 1903 06/11/12 2135  BP: 164/83 125/87  Pulse: 61 55  Temp: 97.7 F (36.5 C)   TempSrc:  Oral   Resp: 16   Height: 5\' 5"  (1.651 m)   Weight: 179 lb (81.194 kg)   SpO2: 100% 99%   CONSTITUTIONAL: Awake, oriented, appears non-toxic HENT: Atraumatic, normocephalic, oral mucosa pink and moist, airway patent. Frontal and maxillary sinuses mildly tender to percussion. Nares patent with scant clear drainage, nasal turbinates erythematous and boggy.. External ears normal. Clear fluid behind the left TM, right TM is clear. No erythema. EYES: Conjunctiva clear, EOMI, PERRLA NECK: Trachea midline, non-tender, supple CARDIOVASCULAR: Normal heart rate, Normal rhythm, No murmurs, rubs, gallops PULMONARY/CHEST: Clear to auscultation, no rhonchi, wheezes, or rales. Symmetrical breath sounds. Non-tender. ABDOMINAL: Non-distended, soft, non-tender - no rebound or guarding.  BS  normal. NEUROLOGIC: Non-focal, moving all four extremities, no gross sensory or motor deficits. Facial sensation equal to light touch bilaterally.  Good muscle bulk in the masseter muscle and good lateral movement of the jaw.  Facial expressions equal and good strength with smile/frown and puffed cheeks.  Hearing grossly intact to finger rub test.  Uvula, tongue are midline with no deviation. Symmetrical palate elevation.  Trapezius and SCM muscles are 5/5 strength bilaterally.   EXTREMITIES: No clubbing, cyanosis, or edema SKIN: Warm, Dry, No erythema, No rash  ED Course  Procedures (including critical care time)  DIAGNOSTIC STUDIES: Oxygen Saturation is 100% on room air, normal by my interpretation.    COORDINATION OF CARE:  7:57 PM- Treatment plan discussed with patient. Pt agrees with treatment.    Labs Reviewed - No data to display No results found.   1. Migraine   2. Sinus congestion   3. Viral syndrome   4. Dizziness       MDM  [name] is a 46 y.o. female with a history of migraine headaches and sinus problems presents with migraine headache and sinus congestion. She's had an upper respiratory type syndrome recently, she also had some dizziness-I think this is likely from congestion from URI.  Patient treated with migraine cocktail - she says she is feeling much better afterward.  Patient would like to go home, we'll give the patient a Compazine and Benadryl to go home with for her headache, and will also give her Benadryl to eating drying out her nasal passages. She's also had Afrin treatment in the ER and will be prescribed the same for home.   do not think she's got a sinus infection at this time and I do not think she requires antibiotics. She is afebrile and nontoxic, her neck is supple a do not think this is a meningitis or infection of the face or CNS.   We'll also give the patient some Sudafed as a decongestant. Patient's blood pressure was initially elevated,  she did not have a history of hypertension and after her pain was resolved, her blood pressure resolved to 124 systolic. I think Sudafed at this point is a safe choice for the patient's congestion needs.  I explained the diagnosis and have given explicit precautions to return to the ER including fevers, chills or any other new or worsening symptoms. The patient understands and accepts the medical plan as it's been dictated and I have answered their questions. Discharge instructions concerning home care and prescriptions have been given.  The patient is STABLE and is discharged to home in good condition.    I personally performed the services described in this documentation, which was scribed in my presence. The recorded information has been reviewed and is accurate. [name], M.D.  Miranda Skene, MD 06/11/12 2340

## 2012-06-11 NOTE — ED Notes (Signed)
C/o headache. She is alert oriented. Ambulatory to treatment room. Placed in a dimly lit room. Warm blankets given. C.o nausea.

## 2012-07-30 ENCOUNTER — Other Ambulatory Visit (HOSPITAL_COMMUNITY): Payer: Self-pay | Admitting: Nurse Practitioner

## 2012-07-30 DIAGNOSIS — Z1231 Encounter for screening mammogram for malignant neoplasm of breast: Secondary | ICD-10-CM

## 2012-08-06 ENCOUNTER — Ambulatory Visit (HOSPITAL_COMMUNITY)
Admission: RE | Admit: 2012-08-06 | Discharge: 2012-08-06 | Disposition: A | Discharge status: Home / Self Care | Payer: Federal, State, Local not specified - PPO | Source: Ambulatory Visit | Attending: Nurse Practitioner | Admitting: Nurse Practitioner

## 2012-08-06 DIAGNOSIS — Z1231 Encounter for screening mammogram for malignant neoplasm of breast: Secondary | ICD-10-CM

## 2013-03-26 ENCOUNTER — Encounter (HOSPITAL_BASED_OUTPATIENT_CLINIC_OR_DEPARTMENT_OTHER): Payer: Self-pay | Admitting: *Deleted

## 2013-03-26 ENCOUNTER — Emergency Department (HOSPITAL_BASED_OUTPATIENT_CLINIC_OR_DEPARTMENT_OTHER)
Admission: EM | Admit: 2013-03-26 | Discharge: 2013-03-26 | Disposition: A | Discharge status: Home / Self Care | Payer: Federal, State, Local not specified - PPO | Attending: Emergency Medicine | Admitting: Emergency Medicine

## 2013-03-26 DIAGNOSIS — E079 Disorder of thyroid, unspecified: Secondary | ICD-10-CM | POA: Insufficient documentation

## 2013-03-26 DIAGNOSIS — R509 Fever, unspecified: Secondary | ICD-10-CM | POA: Insufficient documentation

## 2013-03-26 DIAGNOSIS — J3489 Other specified disorders of nose and nasal sinuses: Secondary | ICD-10-CM | POA: Insufficient documentation

## 2013-03-26 DIAGNOSIS — Z79899 Other long term (current) drug therapy: Secondary | ICD-10-CM | POA: Insufficient documentation

## 2013-03-26 DIAGNOSIS — R42 Dizziness and giddiness: Secondary | ICD-10-CM | POA: Insufficient documentation

## 2013-03-26 DIAGNOSIS — IMO0002 Reserved for concepts with insufficient information to code with codable children: Secondary | ICD-10-CM | POA: Insufficient documentation

## 2013-03-26 DIAGNOSIS — R059 Cough, unspecified: Secondary | ICD-10-CM | POA: Insufficient documentation

## 2013-03-26 DIAGNOSIS — R0982 Postnasal drip: Secondary | ICD-10-CM | POA: Insufficient documentation

## 2013-03-26 DIAGNOSIS — R51 Headache: Secondary | ICD-10-CM | POA: Insufficient documentation

## 2013-03-26 DIAGNOSIS — R05 Cough: Secondary | ICD-10-CM | POA: Insufficient documentation

## 2013-03-26 DIAGNOSIS — R0981 Nasal congestion: Secondary | ICD-10-CM

## 2013-03-26 MED ORDER — MOMETASONE FUROATE 50 MCG/ACT NA SUSP: 2.0000 | Freq: Every day | NASAL | Status: DC

## 2013-03-26 MED ORDER — OXYMETAZOLINE HCL 0.05 % NA SOLN
NASAL | Status: AC
  Filled 2013-03-26: qty 15

## 2013-03-26 MED ORDER — OXYMETAZOLINE HCL 0.05 % NA SOLN
1.0000 | Freq: Two times a day (BID) | NASAL | Status: DC
  Administered 2013-03-26: 1 via NASAL

## 2013-03-26 NOTE — ED Notes (Signed)
Pt reports nasal congestion, cough sore throat since sat. Took ibuprofen, no decongestant medication, swollen nares to left, moderate to right per Md, + sinus pressure on percussion of sinuses,

## 2013-03-26 NOTE — ED Notes (Signed)
Pt c/o nasal congestion with facial pain x 4 days

## 2013-03-26 NOTE — ED Provider Notes (Signed)
CSN: [id number]     Arrival date & time 03/26/13  1752 History   This chart was scribed for Charles B. Bernette Mayers, MD by Dorothey Baseman, ED Scribe. This patient was seen in room MH01/MH01 and the patient's care was started at 8:16 PM.    Chief Complaint  Patient presents with  . Nasal Congestion   The history is provided by the patient. No language interpreter was used.   HPI Comments: [name] is a 47 y.o. female who presents to the Emergency Department complaining of nasal congestion with associated headache, dizziness, cough, subjective fever, and postnasal drip onset 3 days ago. Patient reports taking ibuprofen at home with mild, temporary relief. Patient reports a history of seasonal allergies. She denies sore throat.   Past Medical History  Diagnosis Date  . Thyroid disease    Past Surgical History  Procedure Laterality Date  . Myomectomy     History reviewed. No pertinent family history. History  Substance Use Topics  . Smoking status: Never Smoker   . Smokeless tobacco: Not on file  . Alcohol Use: No   OB History   Grav Para Term Preterm Abortions TAB SAB Ect Mult Living                 Review of Systems  A complete 10 system review of systems was obtained and all systems are negative except as noted in the HPI and PMH.   Allergies  Codeine  Home Medications   Current Outpatient Rx  Name  Route  Sig  Dispense  Refill  . ibuprofen (ADVIL,MOTRIN) 800 MG tablet   Oral   Take 800 mg by mouth every 8 (eight) hours as needed for pain.         . progesterone (PROMETRIUM) 100 MG capsule   Oral   Take 100 mg by mouth daily.         . valACYclovir (VALTREX) 500 MG tablet   Oral   Take 500 mg by mouth 2 (two) times daily.         Marland Kitchen HYDROcodone-acetaminophen (VICODIN) 5-500 MG per tablet   Oral   Take 1 tablet by mouth every 6 (six) hours as needed for pain (no driving while medicated).   10 tablet   0   . pseudoephedrine (SUDAFED) 30 MG tablet   Oral   Take 1 tablet (30 mg total) by mouth every 4 (four) hours as needed for congestion.   30 tablet   0   . thyroid (ARMOUR) 60 MG tablet   Oral   Take 60 mg by mouth daily.          Triage Vitals: BP 122/84  Pulse 84  Temp(Src) 98.5 F (36.9 C) (Oral)  Resp 16  Ht 5' 5.75" (1.67 m)  Wt 179 lb (81.194 kg)  BMI 29.11 kg/m2  SpO2 99%  LMP 03/12/2013  Physical Exam  Nursing note and vitals reviewed. Constitutional: She is oriented to person, place, and time. She appears well-developed and well-nourished.  HENT:  Head: Normocephalic and atraumatic.  Nose: Mucosal edema and rhinorrhea present. Right sinus exhibits maxillary sinus tenderness and frontal sinus tenderness. Left sinus exhibits maxillary sinus tenderness and frontal sinus tenderness.  Mouth/Throat: Oropharynx is clear and moist.  Eyes: Conjunctivae and EOM are normal. Pupils are equal, round, and reactive to light.  Neck: Normal range of motion. Neck supple.  Cardiovascular: Normal rate, normal heart sounds and intact distal pulses.   Pulmonary/Chest: Effort normal and breath  sounds normal.  Abdominal: Bowel sounds are normal. She exhibits no distension. There is no tenderness.  Musculoskeletal: Normal range of motion. She exhibits no edema and no tenderness.  Neurological: She is alert and oriented to person, place, and time. She has normal strength. No cranial nerve deficit or sensory deficit.  Skin: Skin is warm and dry. No rash noted.  Psychiatric: She has a normal mood and affect. Her behavior is normal.    ED Course  Procedures (including critical care time)  DIAGNOSTIC STUDIES: Oxygen Saturation is 99% on room air, normal by my interpretation.    COORDINATION OF CARE: 8:18PM- Discussed that symptoms are likely not due to a sinus infection and antibiotics will not be necessary. Advised patient to stay hydrated and to take Sudafed at home. Will discharge patient with Nasonex and Afrin. Advised patient to follow  up with her PCP if symptoms do not improve in a week. Discussed treatment plan with patient at bedside and patient verbalized agreement.     Labs Review Labs Reviewed - No data to display Imaging Review No results found.  MDM   1. Nasal congestion     I personally performed the services described in this documentation, which was scribed in my presence. The recorded information has been reviewed and is accurate.       Charles B. Bernette Mayers, MD 03/27/13 1610

## 2013-09-19 ENCOUNTER — Other Ambulatory Visit (HOSPITAL_COMMUNITY): Payer: Self-pay | Admitting: Obstetrics and Gynecology

## 2013-09-19 DIAGNOSIS — Z1231 Encounter for screening mammogram for malignant neoplasm of breast: Secondary | ICD-10-CM

## 2013-09-26 ENCOUNTER — Ambulatory Visit (HOSPITAL_COMMUNITY): Payer: Federal, State, Local not specified - PPO | Attending: Obstetrics and Gynecology

## 2013-10-08 ENCOUNTER — Ambulatory Visit (HOSPITAL_COMMUNITY): Payer: Federal, State, Local not specified - PPO

## 2013-10-10 ENCOUNTER — Ambulatory Visit (HOSPITAL_COMMUNITY): Admission: RE | Admit: 2013-10-10 | Payer: Federal, State, Local not specified - PPO | Source: Ambulatory Visit

## 2013-10-15 ENCOUNTER — Ambulatory Visit (HOSPITAL_COMMUNITY)
Admission: RE | Admit: 2013-10-15 | Discharge: 2013-10-15 | Disposition: A | Discharge status: Home / Self Care | Payer: Federal, State, Local not specified - PPO | Source: Ambulatory Visit | Attending: Obstetrics and Gynecology | Admitting: Obstetrics and Gynecology

## 2013-10-15 DIAGNOSIS — Z1231 Encounter for screening mammogram for malignant neoplasm of breast: Secondary | ICD-10-CM

## 2013-10-24 ENCOUNTER — Emergency Department (HOSPITAL_BASED_OUTPATIENT_CLINIC_OR_DEPARTMENT_OTHER)
Admission: EM | Admit: 2013-10-24 | Discharge: 2013-10-24 | Disposition: A | Discharge status: Home / Self Care | Payer: Federal, State, Local not specified - PPO | Attending: Emergency Medicine | Admitting: Emergency Medicine

## 2013-10-24 ENCOUNTER — Encounter (HOSPITAL_BASED_OUTPATIENT_CLINIC_OR_DEPARTMENT_OTHER): Payer: Self-pay | Admitting: Emergency Medicine

## 2013-10-24 DIAGNOSIS — E039 Hypothyroidism, unspecified: Secondary | ICD-10-CM | POA: Insufficient documentation

## 2013-10-24 DIAGNOSIS — R350 Frequency of micturition: Secondary | ICD-10-CM | POA: Insufficient documentation

## 2013-10-24 DIAGNOSIS — G43909 Migraine, unspecified, not intractable, without status migrainosus: Secondary | ICD-10-CM | POA: Insufficient documentation

## 2013-10-24 DIAGNOSIS — Z3202 Encounter for pregnancy test, result negative: Secondary | ICD-10-CM | POA: Insufficient documentation

## 2013-10-24 DIAGNOSIS — J45909 Unspecified asthma, uncomplicated: Secondary | ICD-10-CM | POA: Insufficient documentation

## 2013-10-24 DIAGNOSIS — Z79899 Other long term (current) drug therapy: Secondary | ICD-10-CM | POA: Insufficient documentation

## 2013-10-24 DIAGNOSIS — IMO0002 Reserved for concepts with insufficient information to code with codable children: Secondary | ICD-10-CM | POA: Insufficient documentation

## 2013-10-24 DIAGNOSIS — Z862 Personal history of diseases of the blood and blood-forming organs and certain disorders involving the immune mechanism: Secondary | ICD-10-CM | POA: Insufficient documentation

## 2013-10-24 DIAGNOSIS — Z8659 Personal history of other mental and behavioral disorders: Secondary | ICD-10-CM | POA: Insufficient documentation

## 2013-10-24 DIAGNOSIS — Z8719 Personal history of other diseases of the digestive system: Secondary | ICD-10-CM | POA: Insufficient documentation

## 2013-10-24 LAB — CBG MONITORING, ED: Glucose-Capillary: 85 mg/dL (ref 70–99)

## 2013-10-24 LAB — URINALYSIS, ROUTINE W REFLEX MICROSCOPIC
BILIRUBIN URINE: NEGATIVE
GLUCOSE, UA: NEGATIVE mg/dL
Hgb urine dipstick: NEGATIVE
Ketones, ur: NEGATIVE mg/dL
Leukocytes, UA: NEGATIVE
Nitrite: NEGATIVE
PH: 6 (ref 5.0–8.0)
PROTEIN: NEGATIVE mg/dL
SPECIFIC GRAVITY, URINE: 1.024 (ref 1.005–1.030)
UROBILINOGEN UA: 1 mg/dL (ref 0.0–1.0)

## 2013-10-24 LAB — PREGNANCY, URINE: Preg Test, Ur: NEGATIVE

## 2013-10-24 MED ORDER — SODIUM CHLORIDE 0.9 % IV BOLUS (SEPSIS)
1000.0000 mL | Freq: Once | INTRAVENOUS | Status: AC
  Administered 2013-10-24: 1000 mL via INTRAVENOUS

## 2013-10-24 MED ORDER — NAPROXEN SODIUM 220 MG PO TABS: 220.0000 mg | ORAL_TABLET | Freq: Three times a day (TID) | ORAL | Status: DC

## 2013-10-24 MED ORDER — KETOROLAC TROMETHAMINE 30 MG/ML IJ SOLN
30.0000 mg | Freq: Once | INTRAMUSCULAR | Status: AC
  Administered 2013-10-24: 30 mg via INTRAVENOUS
  Filled 2013-10-24: qty 1

## 2013-10-24 NOTE — ED Notes (Signed)
CBG 85

## 2013-10-24 NOTE — ED Provider Notes (Signed)
Patient seen/examined in the Emergency Department in conjunction with Resident Physician Provider  Patient reports headache and urinary frequency  Exam : awake/alert, no arm/leg drift, no facial droop Plan: pt well appearing, low suspicion for acute neurologic event at this time   Sharyon Cable, MD 10/24/13 1233

## 2013-10-24 NOTE — ED Provider Notes (Signed)
I have personally seen and examined the patient.  I have discussed the plan of care with the resident.  I have reviewed the documentation on PMH/FH/Soc. History.  I have reviewed the documentation of the resident and agree.   Sharyon Cable, MD 10/24/13 [phone]

## 2013-10-24 NOTE — ED Provider Notes (Signed)
CSN: [id number]     Arrival date & time 10/24/13  1047 History   First MD Initiated Contact with Patient 10/24/13 1109     Chief Complaint  Patient presents with  . Headache  . Urinary Frequency     HPI - 48 y.o. female with h/o anemia, asthma, depression, GERD, migraine, and hypothyroidism presenting with headache, urinary symptoms, and nausea.   Headache present for 4 days and severe. Pain is behind left eye and left forehead and feels different from her typical migraines, though she admits her migraines are usually all different. It is not the worst HA of her life and onset was not sudden. She denies neurologic changes such as change in speech, gait, or sudden weakness. She denies fever but had chills yesterday. She has had photophobia, phonophobia, mildly blurred vision and nausea but vision loss, no emesis, dizziness, or syncope. She takes ibuprofen 800mg  and excedrin but nothing has helped and HA has not changed in 4 days which worried her. She has not taken anything this morning. She also denies chest pain, shortness of breath, diarrhea, cough, sneeze, or other complaints. She has about 3 migraines a year in the last 2 years, though previously she had fewer. She thinks they are related to her allergies, as she has not taken her nasonex in 2 weeks. She also lost weight and has not used her CPAP in a few years but occasionally wakes up with a headache. She admits to being a stressed person.  Urinary frequency - Last night patient had urinary frequency. Denies urgency, dysuria, abdominal pain, or fever but has had chills. Has not had symptoms like this previously.   Past Medical History  Diagnosis Date  . Thyroid disease   Anemia Asthma Depression GERD Hypothyroidism  Past Surgical History  Procedure Laterality Date  . Myomectomy     No family history on file. History  Substance Use Topics  . Smoking status: Never Smoker   . Smokeless tobacco: Not on file  . Alcohol Use: No    OB History   Grav Para Term Preterm Abortions TAB SAB Ect Mult Living                 Review of Systems Per HPI  Allergies  Codeine  Home Medications   Prior to Admission medications   Medication Sig Start Date End Date Taking? Authorizing Provider  HYDROcodone-acetaminophen (VICODIN) 5-500 MG per tablet Take 1 tablet by mouth every 6 (six) hours as needed for pain (no driving while medicated). 03/15/12   Teressa Lower, MD  ibuprofen (ADVIL,MOTRIN) 800 MG tablet Take 800 mg by mouth every 8 (eight) hours as needed for pain.    Historical Provider, MD  mometasone (NASONEX) 50 MCG/ACT nasal spray Place 2 sprays into the nose daily. 03/26/13   Charles B. Karle Starch, MD  progesterone (PROMETRIUM) 100 MG capsule Take 100 mg by mouth daily.    Historical Provider, MD  pseudoephedrine (SUDAFED) 30 MG tablet Take 1 tablet (30 mg total) by mouth every 4 (four) hours as needed for congestion. 06/11/12   John-Adam Bonk, MD  thyroid (ARMOUR) 60 MG tablet Take 60 mg by mouth daily.    Historical Provider, MD  valACYclovir (VALTREX) 500 MG tablet Take 500 mg by mouth 2 (two) times daily.    Historical Provider, MD   BP 141/90  Pulse 77  Temp(Src) 98 F (36.7 C) (Oral)  Resp 16  Ht 5' 5.75" (1.67 m)  Wt 180 lb (81.647 kg)  BMI 29.28 kg/m2  SpO2 100%  LMP 10/14/2013 Physical Exam GEN: NAD, lights off in room and pt lying with eyes shut HEENT: Atraumatic, normocephalic, neck supple, EOMI, sclera clear, PERRL, o/p clear with MMM, no sinus tenderness, Shotty submandibular LAD CV: RRR, no murmurs, rubs, or gallops, 2+ bilateral radial pulses PULM: CTAB, normal effort ABD: Soft, nontender, nondistended, NABS, no organomegaly SKIN: No rash or cyanosis; warm and well-perfused EXTR: No lower extremity edema or calf tenderness PSYCH: Mood and affect euthymic, normal rate and volume of speech NEURO: CN 2-12 tested and intact. Awake, alert, no focal deficits grossly, normal speech and gait.  ED Course   Procedures (including critical care time) Labs Review Labs Reviewed  URINALYSIS, ROUTINE W REFLEX MICROSCOPIC  PREGNANCY, URINE  CBG MONITORING, ED    Imaging Review No results found.   EKG Interpretation None     MDM   Final diagnoses:  Migraine headache   Urinary symptoms - Afebrile and VSS with no dysuria or abdominal tenderness. Upreg, UA negative. CBG normal. - Monitor and seek evaluation with PCP if symptoms do not resolve. - Seek immediate care if fever, dysuria, or abdominal pain develop.  Headache - Most likely migraine by hx and physical exam. DDx includes cavernous sinus thrombosis, dissection, SAH, meningitis though these are very unlikely with no neurologic abnormalities or vital sign instability. - Resolved in ED with IV toradol and 1L NS. - D/c'ed with instructions to use aleve if migraine develops, and do not use with ibuprofen. - Recommended stress relief, hydration, and headache diary, along with f/u with PCP for consideration of longer-term tx. - Also recommended taking allergy medication (nasonex) regularly and using CPAP regularly to see if this improved symptoms. - Return precautions reviewed.  Hilton Sinclair, MD PGY-2, [place], MD 10/24/13 [phone]

## 2013-10-24 NOTE — ED Notes (Signed)
Headache x 4 days. Nausea. Urinary frequency.

## 2013-10-24 NOTE — Discharge Instructions (Signed)
You had a migraine that was treated with IV fluids and IV toradol. If you develop neurologic changes, difficulty talking or walking, severe pain again, or other concerns, seek immediate care. Follow up with a primary care doctor. I am prescribing aleve 220mg  tablets to take if headache develops again.  Take 2-3 tablets once if migraine develops. Can repeat with 1-2 tablets in 4 hours if no improvement. No more than 1250mg  in 24 hours.  Do not take regularly, and be sure to follow up with a primary doctor. Stay hydrated and try to work on stress relief techniques.  If your urinary symptoms do not improve, also follow up with primary doctor.

## 2013-11-18 ENCOUNTER — Encounter (HOSPITAL_BASED_OUTPATIENT_CLINIC_OR_DEPARTMENT_OTHER): Payer: Self-pay | Admitting: Emergency Medicine

## 2013-11-18 ENCOUNTER — Emergency Department (HOSPITAL_BASED_OUTPATIENT_CLINIC_OR_DEPARTMENT_OTHER)
Admission: EM | Admit: 2013-11-18 | Discharge: 2013-11-18 | Disposition: A | Discharge status: Home / Self Care | Payer: Federal, State, Local not specified - PPO | Attending: Emergency Medicine | Admitting: Emergency Medicine

## 2013-11-18 DIAGNOSIS — Z8742 Personal history of other diseases of the female genital tract: Secondary | ICD-10-CM | POA: Insufficient documentation

## 2013-11-18 DIAGNOSIS — R519 Headache, unspecified: Secondary | ICD-10-CM

## 2013-11-18 DIAGNOSIS — R110 Nausea: Secondary | ICD-10-CM | POA: Insufficient documentation

## 2013-11-18 DIAGNOSIS — Z79899 Other long term (current) drug therapy: Secondary | ICD-10-CM | POA: Insufficient documentation

## 2013-11-18 DIAGNOSIS — H53149 Visual discomfort, unspecified: Secondary | ICD-10-CM | POA: Insufficient documentation

## 2013-11-18 DIAGNOSIS — Z791 Long term (current) use of non-steroidal anti-inflammatories (NSAID): Secondary | ICD-10-CM | POA: Insufficient documentation

## 2013-11-18 DIAGNOSIS — E079 Disorder of thyroid, unspecified: Secondary | ICD-10-CM | POA: Insufficient documentation

## 2013-11-18 DIAGNOSIS — R51 Headache: Secondary | ICD-10-CM | POA: Insufficient documentation

## 2013-11-18 DIAGNOSIS — IMO0002 Reserved for concepts with insufficient information to code with codable children: Secondary | ICD-10-CM | POA: Insufficient documentation

## 2013-11-18 DIAGNOSIS — Z3202 Encounter for pregnancy test, result negative: Secondary | ICD-10-CM | POA: Insufficient documentation

## 2013-11-18 HISTORY — DX: Benign neoplasm of connective and other soft tissue, unspecified: D21.9

## 2013-11-18 HISTORY — DX: Headache: R51

## 2013-11-18 HISTORY — DX: Headache, unspecified: R51.9

## 2013-11-18 LAB — PREGNANCY, URINE: Preg Test, Ur: NEGATIVE

## 2013-11-18 MED ORDER — KETOROLAC TROMETHAMINE 30 MG/ML IJ SOLN
30.0000 mg | Freq: Once | INTRAMUSCULAR | Status: AC
  Administered 2013-11-18: 30 mg via INTRAVENOUS
  Filled 2013-11-18: qty 1

## 2013-11-18 MED ORDER — ACETAMINOPHEN 500 MG PO TABS
1000.0000 mg | ORAL_TABLET | Freq: Once | ORAL | Status: AC
  Administered 2013-11-18: 1000 mg via ORAL
  Filled 2013-11-18: qty 2

## 2013-11-18 MED ORDER — METOCLOPRAMIDE HCL 10 MG PO TABS: 10.0000 mg | ORAL_TABLET | Freq: Four times a day (QID) | ORAL | Status: DC

## 2013-11-18 MED ORDER — SODIUM CHLORIDE 0.9 % IV BOLUS (SEPSIS)
1000.0000 mL | Freq: Once | INTRAVENOUS | Status: AC
  Administered 2013-11-18: 1000 mL via INTRAVENOUS

## 2013-11-18 MED ORDER — ACETAMINOPHEN 500 MG PO TABS
ORAL_TABLET | ORAL | Status: AC
  Filled 2013-11-18: qty 2

## 2013-11-18 NOTE — ED Provider Notes (Signed)
CSN: [id number]     Arrival date & time 11/18/13  1103 History   First MD Initiated Contact with Patient 11/18/13 1110     Chief Complaint  Patient presents with  . Headache     (Consider location/radiation/quality/duration/timing/severity/associated sxs/prior Treatment) Patient is a 48 y.o. female presenting with headaches.  Headache Pain location:  Frontal Quality:  Dull Radiates to:  Does not radiate Pain severity now: severe. Onset quality:  Gradual Duration:  2 days Timing:  Constant Progression:  Unchanged Chronicity:  Recurrent Similar to prior headaches: yes   Context comment:  Initially noticed when she woke up two days ago.  Gradually worse throughout that day.  Has been severe since then,. Relieved by:  Nothing Worsened by:  Activity, light and sound Ineffective treatments:  NSAIDs Associated symptoms: nausea and photophobia   Associated symptoms: no abdominal pain, no fever, no neck pain, no neck stiffness and no vomiting     Past Medical History  Diagnosis Date  . Thyroid disease   . Frequent headaches   . Fibroids    Past Surgical History  Procedure Laterality Date  . Myomectomy     No family history on file. History  Substance Use Topics  . Smoking status: Never Smoker   . Smokeless tobacco: Not on file  . Alcohol Use: Yes     Comment: occasional   OB History   Grav Para Term Preterm Abortions TAB SAB Ect Mult Living                 Review of Systems  Constitutional: Negative for fever.  Eyes: Positive for photophobia.  Gastrointestinal: Positive for nausea. Negative for vomiting and abdominal pain.  Musculoskeletal: Negative for neck pain and neck stiffness.  Neurological: Positive for headaches.  All other systems reviewed and are negative.     Allergies  Codeine  Home Medications   Prior to Admission medications   Medication Sig Start Date End Date Taking? Authorizing Provider  HYDROcodone-acetaminophen (VICODIN) 5-500 MG per  tablet Take 1 tablet by mouth every 6 (six) hours as needed for pain (no driving while medicated). 03/15/12   Teressa Lower, MD  ibuprofen (ADVIL,MOTRIN) 800 MG tablet Take 800 mg by mouth every 8 (eight) hours as needed for pain.    Historical Provider, MD  mometasone (NASONEX) 50 MCG/ACT nasal spray Place 2 sprays into the nose daily. 03/26/13   Charles B. Karle Starch, MD  naproxen sodium (ALEVE) 220 MG tablet Take 1 tablet (220 mg total) by mouth 3 (three) times daily with meals. 10/24/13   Hilton Sinclair, MD  progesterone (PROMETRIUM) 100 MG capsule Take 100 mg by mouth daily.    Historical Provider, MD  pseudoephedrine (SUDAFED) 30 MG tablet Take 1 tablet (30 mg total) by mouth every 4 (four) hours as needed for congestion. 06/11/12   Shalinda Burkholder-Adam Bonk, MD  thyroid (ARMOUR) 60 MG tablet Take 60 mg by mouth daily.    Historical Provider, MD  valACYclovir (VALTREX) 500 MG tablet Take 500 mg by mouth 2 (two) times daily.    Historical Provider, MD   BP 135/88  Pulse 76  Temp(Src) 98 F (36.7 C) (Oral)  Resp 16  Ht 5\' 5"  (1.651 m)  Wt 180 lb (81.647 kg)  BMI 29.95 kg/m2  SpO2 100%  LMP 10/14/2013 Physical Exam  Nursing note and vitals reviewed. Constitutional: She is oriented to person, place, and time. She appears well-developed and well-nourished. No distress.  HENT:  Head: Normocephalic and atraumatic.  Mouth/Throat: Oropharynx is clear and moist.  Eyes: Conjunctivae are normal. Pupils are equal, round, and reactive to light. No scleral icterus.  Neck: Neck supple.  Cardiovascular: Normal rate, regular rhythm, normal heart sounds and intact distal pulses.   No murmur heard. Pulmonary/Chest: Effort normal and breath sounds normal. No stridor. No respiratory distress. She has no rales.  Abdominal: Soft. Bowel sounds are normal. She exhibits no distension. There is no tenderness.  Musculoskeletal: Normal range of motion.  Neurological: She is alert and oriented to person, place, and time.  She has normal strength. No cranial nerve deficit or sensory deficit. Coordination and gait normal. GCS eye subscore is 4. GCS verbal subscore is 5. GCS motor subscore is 6.  Skin: Skin is warm and dry. No rash noted.  Psychiatric: She has a normal mood and affect. Her behavior is normal.    ED Course  Procedures (including critical care time) Labs Review Labs Reviewed  PREGNANCY, URINE    Imaging Review No results found.   EKG Interpretation None      MDM   Final diagnoses:  Headache    48 yo female presenting with gradual onset headache similar to prior headaches.  History and exam are not consistent with SAH or meningitis.  She is in the process of getting a referral to see a headache specialist.  She prefers to avoid medications that might make her drowsy, but reports toradol has helped in the past.  Will try this again today.    Toradol helped to an extent.  Tylenol given as well.  Felt well enough to go home.  She will follow up with neurology regarding her headaches.    Houston Siren III, MD 11/18/13 1520

## 2013-11-18 NOTE — ED Notes (Signed)
Headache that started Saturday unrelieved after taking Aleve.

## 2013-11-18 NOTE — Discharge Instructions (Signed)

## 2013-12-02 ENCOUNTER — Ambulatory Visit: Payer: Federal, State, Local not specified - PPO | Admitting: Family Medicine

## 2013-12-02 DIAGNOSIS — Z0289 Encounter for other administrative examinations: Secondary | ICD-10-CM

## 2014-06-21 ENCOUNTER — Ambulatory Visit (INDEPENDENT_AMBULATORY_CARE_PROVIDER_SITE_OTHER): Payer: Federal, State, Local not specified - PPO | Admitting: Physician Assistant

## 2014-06-21 VITALS — BP 122/80 | HR 88 | Temp 97.2°F | Resp 18 | Ht 66.0 in | Wt 190.6 lb

## 2014-06-21 DIAGNOSIS — J302 Other seasonal allergic rhinitis: Secondary | ICD-10-CM

## 2014-06-21 DIAGNOSIS — E559 Vitamin D deficiency, unspecified: Secondary | ICD-10-CM

## 2014-06-21 DIAGNOSIS — J0101 Acute recurrent maxillary sinusitis: Secondary | ICD-10-CM

## 2014-06-21 MED ORDER — TRIAMCINOLONE ACETONIDE 55 MCG/ACT NA AERO: 2.0000 | INHALATION_SPRAY | Freq: Every day | NASAL | Status: DC

## 2014-06-21 MED ORDER — PREDNISONE 10 MG PO TABS: ORAL_TABLET | ORAL | Status: AC

## 2014-06-21 MED ORDER — GUAIFENESIN ER 1200 MG PO TB12: 1.0000 | ORAL_TABLET | Freq: Two times a day (BID) | ORAL | Status: AC

## 2014-06-21 MED ORDER — AMOXICILLIN 875 MG PO TABS: 875.0000 mg | ORAL_TABLET | Freq: Two times a day (BID) | ORAL | Status: DC

## 2014-06-21 MED ORDER — CHOLECALCIFEROL 100 MCG (4000 UT) PO CAPS: 4000.0000 [IU] | ORAL_CAPSULE | Freq: Every day | ORAL | Status: DC

## 2014-06-21 NOTE — Progress Notes (Signed)
   Subjective:    Patient ID: [name], female    DOB: 06-05-1966, 49 y.o.   MRN: [id number]  HPI Pt presents to clinic with sinus pressure that has been going on about 2 weeks but got worse 3 days ago.  She has been fighting allergy symptoms and thought she was getting better but then her brother died last week and the stress associated and the lack of sleep she feels like she got more sick and then 3 days ago was his funeral in the rain.  She currently has sinus pressure and congestion and her upper teeth are sore but not painful and she has a headache.  She has some PND but is unable to get anything out when she blows her nose.    Holistic NP - genetic Tammy Worrell - deficiency in Vit D and B and would like a Rx for Vit D supplementation.   Review of Systems  Constitutional: Positive for chills. Negative for fever.  HENT: Positive for congestion and sinus pressure. Negative for postnasal drip and rhinorrhea.   Respiratory: Positive for cough.        H/o asthma - has not had any problems, nonsmoker  Neurological: Positive for headaches. Negative for dizziness.       Objective:   Physical Exam  Constitutional: She is oriented to person, place, and time. She appears well-developed and well-nourished.  BP 122/80 mmHg  Pulse 88  Temp(Src) 97.2 F (36.2 C) (Oral)  Resp 18  Ht 5\' 6"  (1.676 m)  Wt 190 lb 9.6 oz (86.456 kg)  BMI 30.78 kg/m2  SpO2 100%  LMP 06/07/2014   HENT:  Head: Normocephalic and atraumatic.  Right Ear: Hearing, tympanic membrane, external ear and ear canal normal.  Left Ear: Hearing, tympanic membrane, external ear and ear canal normal.  Nose: Mucosal edema (pale and swollen - the right side turbinates are touching the septum) present.  Mouth/Throat: Uvula is midline, oropharynx is clear and moist and mucous membranes are normal.  Eyes: Conjunctivae are normal.  Neck: Normal range of motion. Neck supple.  Pulmonary/Chest: Effort normal.  Neurological:  She is alert and oriented to person, place, and time.  Skin: Skin is warm and dry.  Psychiatric: She has a normal mood and affect. Her behavior is normal. Judgment and thought content normal.       Assessment & Plan:  Acute recurrent maxillary sinusitis - Plan: amoxicillin (AMOXIL) 875 MG tablet, Guaifenesin (MUCINEX MAXIMUM STRENGTH) 1200 MG TB12  Vitamin D deficiency - Plan: Cholecalciferol 4000 UNITS CAPS  Seasonal allergic rhinitis - Plan: triamcinolone (NASACORT AQ) 55 MCG/ACT AERO nasal inhaler, predniSONE (DELTASONE) 10 MG tablet   Pt has uncontrolled allergies and she experienced a family death which most likely decrease her immune system making her suseptible to other infections.  She will be treated with abx and steroids due to the level of congestion.  She had a lengthy conversation regarding her need for daily allergy treatment to prevent these sinus infections from occurring.  Windell Hummingbird PA-C  [facility] 06/21/2014 5:15 PM

## 2014-06-22 ENCOUNTER — Telehealth: Payer: Self-pay

## 2014-06-22 NOTE — Telephone Encounter (Signed)
PATIENT STATES SHE WAS IN THE OFFICE TO SEE SARAH WEBER YESTERDAY FOR A SINUS INFECTION. SARAH WAS GOING TO SEND IN AN INHALER FOR HER ASTHMA THAT DOES NOT CAUSE HER TO GET DIZZY. THE PHARMACY NEVER RECEIVED IT. BEST PHONE [phone] (CELL)  PHARMACY CHOICE IS [facility] ON [address].  [place]

## 2014-06-23 NOTE — Telephone Encounter (Signed)
Do not see an inhaler on her plan in the last OV. Please advise.

## 2014-06-24 MED ORDER — LEVALBUTEROL TARTRATE 45 MCG/ACT IN AERO: 2.0000 | INHALATION_SPRAY | Freq: Four times a day (QID) | RESPIRATORY_TRACT | Status: DC | PRN

## 2014-06-24 NOTE — Telephone Encounter (Signed)
Please apologize we looked in her old chart and it was not there.  Please make sure that she is referring to an inhaler for asthma - I sent in xopenex.

## 2014-06-25 NOTE — Telephone Encounter (Signed)
Spoke with pt. Notified that inhaler has been sent to pharm. She is still very congested. She is almost done with Pred and feels like Amox is not helping. She said she had this same thing a few months ago and did well on Zpak. Wants to know if she can switch abx. Please advise. Thanks

## 2014-06-26 MED ORDER — AZITHROMYCIN 250 MG PO TABS: ORAL_TABLET | ORAL | Status: AC

## 2014-06-26 MED ORDER — FLUCONAZOLE 150 MG PO TABS: 150.0000 mg | ORAL_TABLET | Freq: Once | ORAL | Status: DC

## 2014-06-26 NOTE — Telephone Encounter (Signed)
Called and spoke to pt. Informed her of the abx switch. Pt would also like Diflucan sent in; says she is known for getting yeast infection after taking abx. Currently no yeast infection symptoms; just wants rx in case she needs it.  Pended order

## 2014-06-26 NOTE — Telephone Encounter (Signed)
Please advise abx switch to a Z pak

## 2014-06-26 NOTE — Telephone Encounter (Signed)
Sent in diflucan per protocol.

## 2014-06-26 NOTE — Telephone Encounter (Signed)
Pt called and states she cannot get rid of the congestion she's having. States she isn't achy anymore but she feels like she can't breathe. Please advise. Pt states she is still taking antibiotics but took her last prednisone today. CB # [phone] states she is at work and cannot answer the phone but can listen to message if one is left.

## 2014-06-26 NOTE — Telephone Encounter (Signed)
I am always worried that zpacks do not work for sinus but if it has worked in the past we can try it.

## 2014-07-15 ENCOUNTER — Ambulatory Visit (INDEPENDENT_AMBULATORY_CARE_PROVIDER_SITE_OTHER): Payer: Federal, State, Local not specified - PPO | Admitting: Physician Assistant

## 2014-07-15 VITALS — BP 130/80 | HR 67 | Temp 98.4°F | Resp 16 | Ht 66.0 in | Wt 196.5 lb

## 2014-07-15 DIAGNOSIS — J0101 Acute recurrent maxillary sinusitis: Secondary | ICD-10-CM

## 2014-07-15 DIAGNOSIS — J302 Other seasonal allergic rhinitis: Secondary | ICD-10-CM

## 2014-07-15 DIAGNOSIS — E038 Other specified hypothyroidism: Secondary | ICD-10-CM

## 2014-07-15 MED ORDER — IPRATROPIUM BROMIDE 0.06 % NA SOLN: 2.0000 | Freq: Three times a day (TID) | NASAL | Status: DC

## 2014-07-15 MED ORDER — AZITHROMYCIN 250 MG PO TABS: ORAL_TABLET | ORAL | Status: AC

## 2014-07-15 NOTE — Patient Instructions (Addendum)
[facility] - Dr Boris Sharper  [address] [place] [address] [phone] ph [phone] fax   Try Claritin or zytrec or allegra for oral allergy medication  Nasal saline to help with dryness in the nose

## 2014-07-15 NOTE — Progress Notes (Signed)
   Subjective:    Patient ID: [name], female    DOB: [birth year], 49 y.o.   MRN: [id number]  HPI Pt presents to clinic with concerns that she has another sinus infection.  When she saw me early this month she got no relief from Amoxil and she called and we switched her to [place] and she states that the pressure and sinus pain resolved but her congestion continued - now over the last 2-3 days she feels like the pressure and sinus pain is returning - she is worried she might have another sinus infection.  She feels like her body is off.  She has clear rhinorrhea but cannot breath hardly at all through either side of her nose.  She felt like the prednisone that I gave her at the last visit did nothing for her congestion.  She has an appt with an allergist on 07/26/2014 and is looking for an alternative medicine specialist for her PCP.  She has been using her nasal spray daily and no Afrin.  Review of Systems  Constitutional: Negative for fever and chills.  HENT: Positive for congestion, postnasal drip, rhinorrhea (clear) and sinus pressure. Negative for sore throat.   Allergic/Immunologic: Positive for environmental allergies.       Objective:   Physical Exam  Constitutional: She is oriented to person, place, and time. She appears well-developed and well-nourished.  BP 130/80 mmHg  Pulse 67  Temp(Src) 98.4 F (36.9 C) (Oral)  Resp 16  Ht 5\' 6"  (1.676 m)  Wt 196 lb 8 oz (89.132 kg)  BMI 31.73 kg/m2  SpO2 100%  LMP 07/04/2014   HENT:  Head: Normocephalic and atraumatic.  Right Ear: Hearing, tympanic membrane, external ear and ear canal normal.  Left Ear: Hearing, tympanic membrane, external ear and ear canal normal.  Nose: Mucosal edema (pale and swollen closed on the right) present.  Mouth/Throat: Uvula is midline, oropharynx is clear and moist and mucous membranes are normal.  Eyes: Conjunctivae are normal.  Neck: Normal range of motion.  Cardiovascular: Normal rate, regular  rhythm and normal heart sounds.   No murmur heard. Pulmonary/Chest: Effort normal and breath sounds normal. She has no wheezes.  Neurological: She is alert and oriented to person, place, and time.  Psychiatric: She has a normal mood and affect. Her behavior is normal. Judgment and thought content normal.       Assessment & Plan:  Acute recurrent maxillary sinusitis - Plan: azithromycin (ZITHROMAX Z-PAK) 250 MG tablet  Seasonal allergic rhinitis - Plan: ipratropium (ATROVENT) 0.06 % nasal spray  Other specified hypothyroidism - Plan: TSH, T4, Free     Windell Hummingbird PA-C  [facility] 07/17/2014 3:30 PM

## 2014-07-16 LAB — TSH: TSH: 0.425 u[IU]/mL (ref 0.350–4.500)

## 2014-07-16 LAB — T4, FREE: Free T4: 0.81 ng/dL (ref 0.80–1.80)

## 2014-07-17 ENCOUNTER — Encounter: Payer: Self-pay | Admitting: Physician Assistant

## 2014-10-01 ENCOUNTER — Other Ambulatory Visit (HOSPITAL_COMMUNITY): Payer: Self-pay | Admitting: Obstetrics and Gynecology

## 2014-10-01 DIAGNOSIS — Z1231 Encounter for screening mammogram for malignant neoplasm of breast: Secondary | ICD-10-CM

## 2014-10-21 ENCOUNTER — Ambulatory Visit (HOSPITAL_COMMUNITY): Admission: RE | Admit: 2014-10-21 | Payer: Federal, State, Local not specified - PPO | Source: Ambulatory Visit

## 2014-10-28 ENCOUNTER — Ambulatory Visit (HOSPITAL_COMMUNITY): Admission: RE | Admit: 2014-10-28 | Payer: Federal, State, Local not specified - PPO | Source: Ambulatory Visit

## 2014-11-04 ENCOUNTER — Ambulatory Visit (HOSPITAL_COMMUNITY): Payer: Federal, State, Local not specified - PPO

## 2014-11-11 ENCOUNTER — Ambulatory Visit (HOSPITAL_COMMUNITY): Payer: Federal, State, Local not specified - PPO

## 2014-11-14 ENCOUNTER — Ambulatory Visit (HOSPITAL_COMMUNITY)
Admission: RE | Admit: 2014-11-14 | Discharge: 2014-11-14 | Disposition: A | Discharge status: Home / Self Care | Payer: Federal, State, Local not specified - PPO | Source: Ambulatory Visit | Attending: Obstetrics and Gynecology | Admitting: Obstetrics and Gynecology

## 2014-11-14 DIAGNOSIS — Z1231 Encounter for screening mammogram for malignant neoplasm of breast: Secondary | ICD-10-CM | POA: Insufficient documentation

## 2015-04-25 ENCOUNTER — Emergency Department (HOSPITAL_BASED_OUTPATIENT_CLINIC_OR_DEPARTMENT_OTHER)
Admission: EM | Admit: 2015-04-25 | Discharge: 2015-04-25 | Discharge status: Against Medical Advice | Payer: Federal, State, Local not specified - PPO | Attending: Physician Assistant | Admitting: Physician Assistant

## 2015-04-25 ENCOUNTER — Emergency Department (HOSPITAL_BASED_OUTPATIENT_CLINIC_OR_DEPARTMENT_OTHER): Payer: Federal, State, Local not specified - PPO

## 2015-04-25 ENCOUNTER — Encounter (HOSPITAL_BASED_OUTPATIENT_CLINIC_OR_DEPARTMENT_OTHER): Payer: Self-pay | Admitting: Emergency Medicine

## 2015-04-25 DIAGNOSIS — M549 Dorsalgia, unspecified: Secondary | ICD-10-CM | POA: Diagnosis not present

## 2015-04-25 DIAGNOSIS — Z8659 Personal history of other mental and behavioral disorders: Secondary | ICD-10-CM | POA: Insufficient documentation

## 2015-04-25 DIAGNOSIS — R2241 Localized swelling, mass and lump, right lower limb: Secondary | ICD-10-CM | POA: Diagnosis not present

## 2015-04-25 DIAGNOSIS — R202 Paresthesia of skin: Secondary | ICD-10-CM | POA: Diagnosis not present

## 2015-04-25 DIAGNOSIS — Z7952 Long term (current) use of systemic steroids: Secondary | ICD-10-CM | POA: Insufficient documentation

## 2015-04-25 DIAGNOSIS — R29898 Other symptoms and signs involving the musculoskeletal system: Secondary | ICD-10-CM

## 2015-04-25 DIAGNOSIS — Z79899 Other long term (current) drug therapy: Secondary | ICD-10-CM | POA: Insufficient documentation

## 2015-04-25 DIAGNOSIS — M6281 Muscle weakness (generalized): Secondary | ICD-10-CM | POA: Insufficient documentation

## 2015-04-25 DIAGNOSIS — Z86018 Personal history of other benign neoplasm: Secondary | ICD-10-CM | POA: Diagnosis not present

## 2015-04-25 DIAGNOSIS — R52 Pain, unspecified: Secondary | ICD-10-CM

## 2015-04-25 DIAGNOSIS — L089 Local infection of the skin and subcutaneous tissue, unspecified: Secondary | ICD-10-CM | POA: Diagnosis present

## 2015-04-25 DIAGNOSIS — M79604 Pain in right leg: Secondary | ICD-10-CM

## 2015-04-25 DIAGNOSIS — Z862 Personal history of diseases of the blood and blood-forming organs and certain disorders involving the immune mechanism: Secondary | ICD-10-CM | POA: Diagnosis not present

## 2015-04-25 DIAGNOSIS — E079 Disorder of thyroid, unspecified: Secondary | ICD-10-CM | POA: Diagnosis not present

## 2015-04-25 LAB — COMPREHENSIVE METABOLIC PANEL
ALT: 12 U/L — ABNORMAL LOW (ref 14–54)
ANION GAP: 4 — AB (ref 5–15)
AST: 17 U/L (ref 15–41)
Albumin: 3.5 g/dL (ref 3.5–5.0)
Alkaline Phosphatase: 47 U/L (ref 38–126)
BUN: 8 mg/dL (ref 6–20)
CHLORIDE: 106 mmol/L (ref 101–111)
CO2: 29 mmol/L (ref 22–32)
Calcium: 8.6 mg/dL — ABNORMAL LOW (ref 8.9–10.3)
Creatinine, Ser: 0.66 mg/dL (ref 0.44–1.00)
GFR calc Af Amer: >60 mL/min (ref >60)
GFR calc non Af Amer: >60 mL/min (ref >60)
GLUCOSE: 95 mg/dL (ref 65–99)
POTASSIUM: 3.4 mmol/L — AB (ref 3.5–5.1)
Sodium: 139 mmol/L (ref 135–145)
Total Bilirubin: 0.5 mg/dL (ref 0.3–1.2)
Total Protein: 7.6 g/dL (ref 6.5–8.1)

## 2015-04-25 LAB — CBC WITH DIFFERENTIAL/PLATELET
BASOS ABS: 0 10*3/uL (ref 0.0–0.1)
Basophils Relative: 0 %
EOS PCT: 2 %
Eosinophils Absolute: 0.1 10*3/uL (ref 0.0–0.7)
HEMATOCRIT: 37.6 % (ref 36.0–46.0)
Hemoglobin: 12.5 g/dL (ref 12.0–15.0)
LYMPHS PCT: 36 %
Lymphs Abs: 2.1 10*3/uL (ref 0.7–4.0)
MCH: 29.1 pg (ref 26.0–34.0)
MCHC: 33.2 g/dL (ref 30.0–36.0)
MCV: 87.4 fL (ref 78.0–100.0)
MONO ABS: 0.5 10*3/uL (ref 0.1–1.0)
MONOS PCT: 8 %
NEUTROS ABS: 3.1 10*3/uL (ref 1.7–7.7)
Neutrophils Relative %: 54 %
PLATELETS: 325 10*3/uL (ref 150–400)
RBC: 4.3 MIL/uL (ref 3.87–5.11)
RDW: 13.7 % (ref 11.5–15.5)
WBC: 5.7 10*3/uL (ref 4.0–10.5)

## 2015-04-25 LAB — CBG MONITORING, ED: GLUCOSE-CAPILLARY: 91 mg/dL (ref 65–99)

## 2015-04-25 MED ORDER — LORAZEPAM 1 MG PO TABS
1.0000 mg | ORAL_TABLET | Freq: Once | ORAL | Status: DC
  Filled 2015-04-25: qty 1

## 2015-04-25 NOTE — ED Notes (Signed)
Patient states that she has bilateral areas of swelling to her left and right thighs.

## 2015-04-25 NOTE — ED Provider Notes (Signed)
CSN: [id number]     Arrival date & time 04/25/15  1358 History   By signing my name below, I, [name], attest that this documentation has been prepared under the direction and in the presence of Miranda Essex, MD . Electronically Signed: Evelene Small, Scribe. 04/25/2015. 5:40 PM.    Chief Complaint  Patient presents with  . Recurrent Skin Infections   The history is provided by the patient. No language interpreter was used.   HPI Comments:  [name] is a 49 y.o. female who presents to the Emergency Department complaining of a swollen area on her right medial thigh which she noticed yesterday. She reports associated irritation, soreness at the site, and tingling/numbness that radiates down her RLE which began this AM .  Pt believes she was bitten by an insect on her posterior thighs bilaterally  ~ 3-4 weeks ago; possibly by a spider but she did not see the insect. She reports mild itching at the site which resolved. she is unsure if the bites are related to her symptom today. No alleviating factors noted. She denies trouble swallowing, bowel/bladder incontinence, CP and SOB. She also denies new lotions, detergents, recent camping trips, long periods of immobilization, h/o CA, DM and h/o IVDA. No tick bites. Pt called PCP who advised pt come in and have Doppler of her RLE to r/o blood clots.    Pt also notes mild lower back pain. She denies h/o chronic back pain, recent injury or fall.    PCP Willey Blade Past Medical History  Diagnosis Date  . Thyroid disease   . Frequent headaches   . Fibroids   . Allergy   . Anemia   . Depression    Past Surgical History  Procedure Laterality Date  . Myomectomy     Family History  Problem Relation Age of Onset  . Cancer Mother   . Stroke Mother   . Heart disease Father   . Diabetes Brother   . Heart disease Brother   . Mental illness Brother   . Diabetes Maternal Grandmother   . Stroke Maternal Grandmother   . Diabetes  Maternal Grandfather    Social History  Substance Use Topics  . Smoking status: Never Smoker   . Smokeless tobacco: Never Used  . Alcohol Use: 0.0 oz/week    0 Standard drinks or equivalent per week     Comment: occasional   OB History    No data available     Review of Systems  Constitutional: Negative for fever and chills.  HENT: Negative for trouble swallowing.   Respiratory: Negative for shortness of breath.   Cardiovascular: Negative for chest pain.  Musculoskeletal: Positive for back pain.  Skin:       +Swelling to Right thigh   Neurological: Positive for numbness. Negative for weakness.  All other systems reviewed and are negative.   Allergies  Codeine  Home Medications   Prior to Admission medications   Medication Sig Start Date End Date Taking? Authorizing Provider  Cholecalciferol 4000 UNITS CAPS Take 1 capsule (4,000 Units total) by mouth daily. 06/21/14   Mancel Bale, PA-C  ipratropium (ATROVENT) 0.06 % nasal spray Place 2 sprays into the nose 3 (three) times daily. 07/15/14   Mancel Bale, PA-C  levalbuterol [facility] HFA) 45 MCG/ACT inhaler Inhale 2 puffs into the lungs every 6 (six) hours as needed for wheezing. Patient not taking: Reported on 07/15/2014 06/24/14   Mancel Bale, PA-C  thyroid [facility])  60 MG tablet Take 60 mg by mouth daily.    Historical Provider, MD  triamcinolone (NASACORT AQ) 55 MCG/ACT AERO nasal inhaler Place 2 sprays into the nose daily. 06/21/14   Mancel Bale, PA-C  valACYclovir (VALTREX) 500 MG tablet Take 500 mg by mouth 2 (two) times daily.    Historical Provider, MD   BP 131/88 mmHg  Pulse 56  Temp(Src) 98.3 F (36.8 C) (Oral)  Resp 20  Ht 5\' 6"  (1.676 m)  Wt 185 lb (83.915 kg)  BMI 29.87 kg/m2  SpO2 100%  LMP 04/25/2015 Physical Exam  Constitutional: She is oriented to person, place, and time. She appears well-developed and well-nourished. No distress.  HENT:  Head: Normocephalic and atraumatic.  Mouth/Throat: Oropharynx  is clear and moist. No oropharyngeal exudate.  Eyes: Conjunctivae and EOM are normal. Pupils are equal, round, and reactive to light.  Neck: Normal range of motion. Neck supple.  No meningismus.  Cardiovascular: Normal rate, regular rhythm, normal heart sounds and intact distal pulses.   No murmur heard. Pulmonary/Chest: Effort normal and breath sounds normal. No respiratory distress.  Abdominal: Soft. There is no tenderness. There is no rebound and no guarding.  Musculoskeletal: Normal range of motion. She exhibits no edema or tenderness.  Minimal swelling to right medial thigh No warmth or erythema; much improved from picture on pt's  Cell phone from yesterday 5/5 strength in bilateral lower extremities. Ankle plantar and dorsiflexion intact. Great toe extension intact bilaterally. +2 DP and PT pulses. Unable to illicit patellar relexes. Normal gait. Sensation intact bilaterally   Neurological: She is alert and oriented to person, place, and time. No cranial nerve deficit. She exhibits normal muscle tone. Coordination normal.  No ataxia on finger to nose bilaterally. No pronator drift. 5/5 strength throughout. CN 2-12 intact.Equal grip strength. Sensation intact.   Skin: Skin is warm.  Psychiatric: She has a normal mood and affect. Her behavior is normal.  Nursing note and vitals reviewed.   ED Course  Procedures   DIAGNOSTIC STUDIES:  Oxygen Saturation is 100% on RA, normal by my interpretation.    COORDINATION OF CARE:  2:27 PM Discussed treatment plan with pt at bedside and pt agreed to plan.  Labs Review Labs Reviewed  COMPREHENSIVE METABOLIC PANEL - Abnormal; Notable for the following:    Potassium 3.4 (*)    Calcium 8.6 (*)    ALT 12 (*)    Anion gap 4 (*)    All other components within normal limits  CBC WITH DIFFERENTIAL/PLATELET  B. BURGDORFI ANTIBODIES  ROCKY MTN SPOTTED FVR ABS PNL(IGG+IGM)  CBG MONITORING, ED    Imaging Review US Venous Img Lower  Bilateral  04/25/2015  CLINICAL DATA:  Swelling in the medial thighs bilaterally for 4 weeks. EXAM: BILATERAL LOWER EXTREMITY VENOUS DOPPLER ULTRASOUND TECHNIQUE: Gray-scale sonography with graded compression, as well as color Doppler and duplex ultrasound were performed to evaluate the lower extremity deep venous systems from the level of the common femoral vein and including the common femoral, femoral, profunda femoral, popliteal and calf veins including the posterior tibial, peroneal and gastrocnemius veins when visible. The superficial great saphenous vein was also interrogated. Spectral Doppler was utilized to evaluate flow at rest and with distal augmentation maneuvers in the common femoral, femoral and popliteal veins. COMPARISON:  None. FINDINGS: RIGHT LOWER EXTREMITY Common Femoral Vein: No evidence of thrombus. Normal compressibility, respiratory phasicity and response to augmentation. Saphenofemoral Junction: No evidence of thrombus. Normal compressibility and flow on color  Doppler imaging. Profunda Femoral Vein: No evidence of thrombus. Normal compressibility and flow on color Doppler imaging. Femoral Vein: No evidence of thrombus. Normal compressibility, respiratory phasicity and response to augmentation. Popliteal Vein: No evidence of thrombus. Normal compressibility, respiratory phasicity and response to augmentation. Calf Veins: No evidence of thrombus. Normal compressibility and flow on color Doppler imaging. Superficial Great Saphenous Vein: No evidence of thrombus. Normal compressibility and flow on color Doppler imaging. Venous Reflux:  None. Other Findings:  None. LEFT LOWER EXTREMITY Common Femoral Vein: No evidence of thrombus. Normal compressibility, respiratory phasicity and response to augmentation. Saphenofemoral Junction: No evidence of thrombus. Normal compressibility and flow on color Doppler imaging. Profunda Femoral Vein: No evidence of thrombus. Normal compressibility and flow on  color Doppler imaging. Femoral Vein: No evidence of thrombus. Normal compressibility, respiratory phasicity and response to augmentation. Popliteal Vein: No evidence of thrombus. Normal compressibility, respiratory phasicity and response to augmentation. Calf Veins: No evidence of thrombus. Normal compressibility and flow on color Doppler imaging. Superficial Great Saphenous Vein: No evidence of thrombus. Normal compressibility and flow on color Doppler imaging. Venous Reflux:  None. Other Findings:  None. IMPRESSION: No evidence of deep venous thrombosis in either lower extremity. Electronically Signed   By: Ilona Sorrel M.D.   On: 04/25/2015 15:16   I have personally reviewed and evaluated these images and lab results as part of my medical decision-making.   EKG Interpretation None      MDM   Final diagnoses:  Paresthesias  Pain of right lower extremity   Swelling and redness to R medial thigh that onset yesterday and has since improved.  This morning had numbness in any R leg that has been constant.  Thinks she was bitten by a spider 1 month ago to R buttock and L posterior thigh and is not sure that is related.  Told by PCP she needs a doppler.  Pulses and strength intact.  Unable to ellicit patellar reflexes.  No cellulitis or abscess. Very low suspicion for DVT but patient insists on dopplers (of both legs).  Dopplers negative.  Discussed that her symptoms are likely due to some environmental exposure that she had yesterday.  The redness and swelling are essentiall resolved compared to pictures on her phone from yesterday.  Patient is worried about persistent "numbness" in leg.  No incontinence, weakness. Low suspicion for cord compression or cauda equina.  Patient concerned that her "leg is asleep" and "dragging when I walk" which is not seen when she walks in exam room. Her numbness may be due to myalgia paresthetica.  Will obtain MRI lumbar spine due to patient's continued concerns.   Also will check labs including lyme titers despite her lack of reported tick exposure.  Dr. Thomasene Lot to assume care.    I personally performed the services described in this documentation, which was scribed in my presence. The recorded information has been reviewed and is accurate.    Miranda Essex, MD 04/25/15 [phone]

## 2015-04-25 NOTE — ED Notes (Signed)
Per pt report spoke with PCP was told to request ultrasound to r/o blood clot. Pt reports also numbness to posterior aspect of leg from thigh to ankle. Also reports headache.

## 2015-04-25 NOTE — ED Notes (Signed)
EDP at bedside, updated pt on current lab results . Answered all questions the patient had at present time.

## 2015-04-27 LAB — B. BURGDORFI ANTIBODIES: B burgdorferi Ab IgG+IgM: <0.91 {ISR} (ref 0.00–0.90)

## 2015-04-27 LAB — ROCKY MTN SPOTTED FVR ABS PNL(IGG+IGM)
RMSF IgG: NEGATIVE
RMSF IgM: 0.16 index (ref 0.00–0.89)

## 2016-05-25 DIAGNOSIS — J019 Acute sinusitis, unspecified: Secondary | ICD-10-CM | POA: Diagnosis not present

## 2016-05-25 DIAGNOSIS — N390 Urinary tract infection, site not specified: Secondary | ICD-10-CM | POA: Diagnosis not present

## 2016-06-26 DIAGNOSIS — R300 Dysuria: Secondary | ICD-10-CM | POA: Diagnosis not present

## 2016-06-26 DIAGNOSIS — N390 Urinary tract infection, site not specified: Secondary | ICD-10-CM | POA: Diagnosis not present

## 2016-06-26 DIAGNOSIS — B379 Candidiasis, unspecified: Secondary | ICD-10-CM | POA: Diagnosis not present

## 2016-06-26 DIAGNOSIS — A499 Bacterial infection, unspecified: Secondary | ICD-10-CM | POA: Diagnosis not present

## 2016-06-27 ENCOUNTER — Other Ambulatory Visit: Payer: Self-pay | Admitting: Internal Medicine

## 2016-06-27 DIAGNOSIS — Z1231 Encounter for screening mammogram for malignant neoplasm of breast: Secondary | ICD-10-CM

## 2016-07-04 ENCOUNTER — Ambulatory Visit
Admission: RE | Admit: 2016-07-04 | Discharge: 2016-07-04 | Disposition: A | Discharge status: Home / Self Care | Payer: Federal, State, Local not specified - PPO | Source: Ambulatory Visit | Attending: Internal Medicine | Admitting: Internal Medicine

## 2016-07-04 DIAGNOSIS — Z1231 Encounter for screening mammogram for malignant neoplasm of breast: Secondary | ICD-10-CM

## 2016-07-08 ENCOUNTER — Other Ambulatory Visit: Payer: Self-pay | Admitting: Internal Medicine

## 2016-07-08 DIAGNOSIS — R928 Other abnormal and inconclusive findings on diagnostic imaging of breast: Secondary | ICD-10-CM

## 2016-07-09 ENCOUNTER — Ambulatory Visit (INDEPENDENT_AMBULATORY_CARE_PROVIDER_SITE_OTHER): Payer: Federal, State, Local not specified - PPO | Admitting: Physician Assistant

## 2016-07-09 VITALS — BP 122/80 | HR 86 | Temp 98.0°F | Resp 18 | Ht 66.0 in | Wt 194.1 lb

## 2016-07-09 DIAGNOSIS — M545 Low back pain, unspecified: Secondary | ICD-10-CM

## 2016-07-09 DIAGNOSIS — R3915 Urgency of urination: Secondary | ICD-10-CM

## 2016-07-09 LAB — POCT URINALYSIS DIP (MANUAL ENTRY)
Bilirubin, UA: NEGATIVE
Blood, UA: NEGATIVE
GLUCOSE UA: NEGATIVE
Ketones, POC UA: NEGATIVE
NITRITE UA: NEGATIVE
PROTEIN UA: NEGATIVE
Spec Grav, UA: 1.02
UROBILINOGEN UA: 0.2
pH, UA: 6.5

## 2016-07-09 LAB — POC MICROSCOPIC URINALYSIS (UMFC)

## 2016-07-09 NOTE — Patient Instructions (Addendum)
Drink a lot of fluids  Heat and NSAIDs for your low back   IF you received an x-ray today, you will receive an invoice from [facility]. Please contact [facility] at [phone] with questions or concerns regarding your invoice.   IF you received labwork today, you will receive an invoice from [facility]. Please contact [facility] at [phone] with questions or concerns regarding your invoice.   Our billing staff will not be able to assist you with questions regarding bills from these companies.  You will be contacted with the lab results as soon as they are available. The fastest way to get your results is to activate your My Chart account. Instructions are located on the last page of this paperwork. If you have not heard from [place] regarding the results in 2 weeks, please contact this office.

## 2016-07-09 NOTE — Progress Notes (Signed)
[name]  MRN: [id number] DOB: May 30, 1966  Subjective:  Pt presents to clinic with concerns that she has a UTI.  She is having urinary urgency and low back pain.  05/25/2016 - treated for UTI with Augmentin - felt like it was getting better - on it for 7 days - felt like she was almost better and then the symptoms returned  - culture showed klebsiella sensitive to Augmentin, cipro and bactrim 06/26/2016 - diagnosed with repeat UTI - given Cipro - does not know if urine was tested - she took this but never felt better - in fact she almost thinks it made her feel worse   Not currently sexually active (not in the last 3 years) - no vaginal symptoms or irritation or disharge  In process of adopting - now has a daughter 75 months old - got her at 83 days old - up at night with her - carseat is really heavy and hard to manipulate esp out of a care - sleeps with her daughter at night - back pain is worse with movement - started low and will when hurting back move up slight on either side of her spine - no pain radiation into leg  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  Genitourinary: Positive for urgency. Negative for dysuria, frequency, menstrual problem and vaginal discharge.  Musculoskeletal: Positive for back pain (low back - worse in the am).    Patient Active Problem List   Diagnosis Date Noted  . UNSPECIFIED HYPOTHYROIDISM 03/11/2010  . ANEMIA-NOS 03/11/2010  . DEPRESSION 03/11/2010  . MIGRAINE HEADACHE 03/11/2010  . ASTHMA 03/11/2010  . GERD 03/11/2010    Current Outpatient Prescriptions on File Prior to Visit  Medication Sig Dispense Refill  . thyroid (ARMOUR) 60 MG tablet Take 60 mg by mouth daily.    . valACYclovir (VALTREX) 500 MG tablet Take 500 mg by mouth 2 (two) times daily.     No current facility-administered medications on file prior to  visit.     Allergies  Allergen Reactions  . Codeine     REACTION: nause and vomiting and pt states feels out of it.    Pt patients past, family and social history were reviewed and updated.   Objective:  BP 122/80   Pulse 86   Temp 98 F (36.7 C) (Oral)   Resp 18   Ht '5\' 6"'  (1.676 m)   Wt 194 lb 2 oz (88.1 kg)   LMP 07/08/2015 (Approximate)   SpO2 98%   BMI 31.33 kg/m   Physical Exam  Constitutional: She is oriented to person, place, and time and well-developed, well-nourished, and in no distress.  HENT:  Head: Normocephalic and atraumatic.  Right Ear: External ear normal.  Left Ear: External ear normal.  Cardiovascular: Normal rate, regular rhythm and normal heart sounds.   No murmur heard. Pulmonary/Chest: Effort normal and breath sounds normal.  Abdominal: Soft. There is no tenderness. There is no CVA tenderness.  Musculoskeletal:       Lumbar back: She exhibits tenderness (across low back). She exhibits normal range of motion.  Neurological: She is alert and oriented to person, place, and time. Gait normal.  Skin: Skin is warm and dry.  Psychiatric: Mood, memory, affect and judgment normal.  Vitals reviewed.   Results for orders placed or performed in visit on 07/09/16  POCT urinalysis dipstick  Result Value Ref Range   Color, UA yellow yellow   Clarity, UA cloudy (A) clear   Glucose, UA negative negative   Bilirubin, UA negative negative   Ketones, POC UA negative negative   Spec Grav, UA 1.020    Blood, UA negative negative   pH, UA 6.5    Protein Ur, POC negative negative   Urobilinogen, UA 0.2    Nitrite, UA Negative Negative   Leukocytes, UA Trace (A) Negative  POCT Microscopic Urinalysis ([facility])  Result Value Ref Range   WBC,UR,HPF,POC Moderate (A) None WBC/hpf   RBC,UR,HPF,POC None None RBC/hpf   Bacteria Few (A) None, Too numerous to count   Mucus Present (A) Absent   Epithelial Cells, UR Per Microscopy Moderate (A) None, Too numerous to count  cells/hpf    Assessment and Plan :  Urinary urgency - Plan: POCT urinalysis dipstick, POCT Microscopic Urinalysis ([facility]), Urine culture, CMP14+EGFR - pt has had 2 rounds of abx for a UTI - send for culture though today's urine looks ok - pt will increase fluid intake  Acute midline low back pain without sciatica - likely musculoskeletal esp with new change in lifestyle with picking up baby, sleeping poorly with child in bed - d/w pt that sleep is important - stretching and NSAIDs were discussed  Windell Hummingbird PA-C  [facility] 07/10/2016 1:56 AM

## 2016-07-10 ENCOUNTER — Encounter: Payer: Self-pay | Admitting: Physician Assistant

## 2016-07-10 LAB — CMP14+EGFR
ALBUMIN: 4 g/dL (ref 3.5–5.5)
ALT: 9 IU/L (ref 0–32)
AST: 12 IU/L (ref 0–40)
Albumin/Globulin Ratio: 1.1 — ABNORMAL LOW (ref 1.2–2.2)
Alkaline Phosphatase: 55 IU/L (ref 39–117)
BUN/Creatinine Ratio: 9 (ref 9–23)
BUN: 7 mg/dL (ref 6–24)
Bilirubin Total: 0.3 mg/dL (ref 0.0–1.2)
CO2: 25 mmol/L (ref 18–29)
CREATININE: 0.74 mg/dL (ref 0.57–1.00)
Calcium: 9.2 mg/dL (ref 8.7–10.2)
Chloride: 100 mmol/L (ref 96–106)
GFR calc non Af Amer: 95 mL/min/{1.73_m2} (ref >59)
GFR, EST AFRICAN AMERICAN: 110 mL/min/{1.73_m2} (ref >59)
GLUCOSE: 81 mg/dL (ref 65–99)
Globulin, Total: 3.5 g/dL (ref 1.5–4.5)
Potassium: 4 mmol/L (ref 3.5–5.2)
Sodium: 139 mmol/L (ref 134–144)
TOTAL PROTEIN: 7.5 g/dL (ref 6.0–8.5)

## 2016-07-11 LAB — URINE CULTURE

## 2016-07-12 ENCOUNTER — Other Ambulatory Visit: Payer: Self-pay | Admitting: Physician Assistant

## 2016-07-12 DIAGNOSIS — R928 Other abnormal and inconclusive findings on diagnostic imaging of breast: Secondary | ICD-10-CM

## 2016-07-13 ENCOUNTER — Ambulatory Visit
Admission: RE | Admit: 2016-07-13 | Discharge: 2016-07-13 | Disposition: A | Discharge status: Home / Self Care | Payer: Federal, State, Local not specified - PPO | Source: Ambulatory Visit | Attending: Internal Medicine | Admitting: Internal Medicine

## 2016-07-13 DIAGNOSIS — R928 Other abnormal and inconclusive findings on diagnostic imaging of breast: Secondary | ICD-10-CM

## 2016-07-13 DIAGNOSIS — R922 Inconclusive mammogram: Secondary | ICD-10-CM | POA: Diagnosis not present

## 2016-07-13 DIAGNOSIS — N6489 Other specified disorders of breast: Secondary | ICD-10-CM | POA: Diagnosis not present

## 2016-08-23 ENCOUNTER — Encounter: Payer: Federal, State, Local not specified - PPO | Admitting: Physician Assistant

## 2016-09-01 ENCOUNTER — Encounter: Payer: Federal, State, Local not specified - PPO | Admitting: Physician Assistant

## 2016-09-02 ENCOUNTER — Ambulatory Visit (INDEPENDENT_AMBULATORY_CARE_PROVIDER_SITE_OTHER): Payer: Federal, State, Local not specified - PPO | Admitting: Physician Assistant

## 2016-09-02 VITALS — BP 132/82 | HR 77 | Temp 98.6°F | Resp 16 | Ht 64.0 in | Wt 190.0 lb

## 2016-09-02 DIAGNOSIS — Z1211 Encounter for screening for malignant neoplasm of colon: Secondary | ICD-10-CM

## 2016-09-02 DIAGNOSIS — Z130 Encounter for screening for diseases of the blood and blood-forming organs and certain disorders involving the immune mechanism: Secondary | ICD-10-CM | POA: Diagnosis not present

## 2016-09-02 DIAGNOSIS — Z0000 Encounter for general adult medical examination without abnormal findings: Secondary | ICD-10-CM | POA: Diagnosis not present

## 2016-09-02 DIAGNOSIS — G8929 Other chronic pain: Secondary | ICD-10-CM | POA: Diagnosis not present

## 2016-09-02 DIAGNOSIS — Z13228 Encounter for screening for other metabolic disorders: Secondary | ICD-10-CM

## 2016-09-02 DIAGNOSIS — G43009 Migraine without aura, not intractable, without status migrainosus: Secondary | ICD-10-CM | POA: Diagnosis not present

## 2016-09-02 DIAGNOSIS — Z13220 Encounter for screening for lipoid disorders: Secondary | ICD-10-CM | POA: Diagnosis not present

## 2016-09-02 DIAGNOSIS — M549 Dorsalgia, unspecified: Secondary | ICD-10-CM | POA: Diagnosis not present

## 2016-09-02 DIAGNOSIS — Z01419 Encounter for gynecological examination (general) (routine) without abnormal findings: Secondary | ICD-10-CM

## 2016-09-02 DIAGNOSIS — E559 Vitamin D deficiency, unspecified: Secondary | ICD-10-CM | POA: Diagnosis not present

## 2016-09-02 DIAGNOSIS — R87610 Atypical squamous cells of undetermined significance on cytologic smear of cervix (ASC-US): Secondary | ICD-10-CM | POA: Diagnosis not present

## 2016-09-02 DIAGNOSIS — E039 Hypothyroidism, unspecified: Secondary | ICD-10-CM

## 2016-09-02 MED ORDER — NAPROXEN 500 MG PO TABS: 500.0000 mg | ORAL_TABLET | Freq: Two times a day (BID) | ORAL | 1 refills | Status: DC

## 2016-09-02 NOTE — Patient Instructions (Addendum)
I will contact you with your lab results as soon as they are available.   If you have not heard from me in 2 weeks, please contact me.  The fastest way to get your results is to register for My Chart (see the instructions on the last page of this printout).   Health Maintenance, Female Adopting a healthy lifestyle and getting preventive care can go a long way to promote health and wellness. Talk with your health care provider about what schedule of regular examinations is right for you. This is a good chance for you to check in with your provider about disease prevention and staying healthy. In between checkups, there are plenty of things you can do on your own. Experts have done a lot of research about which lifestyle changes and preventive measures are most likely to keep you healthy. Ask your health care provider for more information. Weight and diet Eat a healthy diet  Be sure to include plenty of vegetables, fruits, low-fat dairy products, and lean protein.  Do not eat a lot of foods high in solid fats, added sugars, or salt.  Get regular exercise. This is one of the most important things you can do for your health.  Most adults should exercise for at least 150 minutes each week. The exercise should increase your heart rate and make you sweat (moderate-intensity exercise).  Most adults should also do strengthening exercises at least twice a week. This is in addition to the moderate-intensity exercise. Maintain a healthy weight  Body mass index (BMI) is a measurement that can be used to identify possible weight problems. It estimates body fat based on height and weight. Your health care provider can help determine your BMI and help you achieve or maintain a healthy weight.  For females 14 years of age and older:  A BMI below 18.5 is considered underweight.  A BMI of 18.5 to 24.9 is normal.  A BMI of 25 to 29.9 is considered overweight.  A BMI of 30 and above is considered  obese. Watch levels of cholesterol and blood lipids  You should start having your blood tested for lipids and cholesterol at 50 years of age, then have this test every 5 years.  You may need to have your cholesterol levels checked more often if:  Your lipid or cholesterol levels are high.  You are older than 50 years of age.  You are at high risk for heart disease. Cancer screening Lung Cancer  Lung cancer screening is recommended for adults 71-92 years old who are at high risk for lung cancer because of a history of smoking.  A yearly low-dose CT scan of the lungs is recommended for people who:  Currently smoke.  Have quit within the past 15 years.  Have at least a 30-pack-year history of smoking. A pack year is smoking an average of one pack of cigarettes a day for 1 year.  Yearly screening should continue until it has been 15 years since you quit.  Yearly screening should stop if you develop a health problem that would prevent you from having lung cancer treatment. Breast Cancer  Practice breast self-awareness. This means understanding how your breasts normally appear and feel.  It also means doing regular breast self-exams. Let your health care provider know about any changes, no matter how small.  If you are in your 20s or 30s, you should have a clinical breast exam (CBE) by a health care provider every 1-3 years as part of  a regular health exam.  If you are 23 or older, have a CBE every year. Also consider having a breast X-ray (mammogram) every year.  If you have a family history of breast cancer, talk to your health care provider about genetic screening.  If you are at high risk for breast cancer, talk to your health care provider about having an MRI and a mammogram every year.  Breast cancer gene (BRCA) assessment is recommended for women who have family members with BRCA-related cancers. BRCA-related cancers include:  Breast.  Ovarian.  Tubal.  Peritoneal  cancers.  Results of the assessment will determine the need for genetic counseling and BRCA1 and BRCA2 testing. Cervical Cancer  Your health care provider may recommend that you be screened regularly for cancer of the pelvic organs (ovaries, uterus, and vagina). This screening involves a pelvic examination, including checking for microscopic changes to the surface of your cervix (Pap test). You may be encouraged to have this screening done every 3 years, beginning at age 25.  For women ages 40-65, health care providers may recommend pelvic exams and Pap testing every 3 years, or they may recommend the Pap and pelvic exam, combined with testing for human papilloma virus (HPV), every 5 years. Some types of HPV increase your risk of cervical cancer. Testing for HPV may also be done on women of any age with unclear Pap test results.  Other health care providers may not recommend any screening for nonpregnant women who are considered low risk for pelvic cancer and who do not have symptoms. Ask your health care provider if a screening pelvic exam is right for you.  If you have had past treatment for cervical cancer or a condition that could lead to cancer, you need Pap tests and screening for cancer for at least 20 years after your treatment. If Pap tests have been discontinued, your risk factors (such as having a new sexual partner) need to be reassessed to determine if screening should resume. Some women have medical problems that increase the chance of getting cervical cancer. In these cases, your health care provider may recommend more frequent screening and Pap tests. Colorectal Cancer  This type of cancer can be detected and often prevented.  Routine colorectal cancer screening usually begins at 50 years of age and continues through 50 years of age.  Your health care provider may recommend screening at an earlier age if you have risk factors for colon cancer.  Your health care provider may also  recommend using home test kits to check for hidden blood in the stool.  A small camera at the end of a tube can be used to examine your colon directly (sigmoidoscopy or colonoscopy). This is done to check for the earliest forms of colorectal cancer.  Routine screening usually begins at age 57.  Direct examination of the colon should be repeated every 5-10 years through 50 years of age. However, you may need to be screened more often if early forms of precancerous polyps or small growths are found. Skin Cancer  Check your skin from head to toe regularly.  Tell your health care provider about any new moles or changes in moles, especially if there is a change in a mole's shape or color.  Also tell your health care provider if you have a mole that is larger than the size of a pencil eraser.  Always use sunscreen. Apply sunscreen liberally and repeatedly throughout the day.  Protect yourself by wearing long sleeves, pants, a  wide-brimmed hat, and sunglasses whenever you are outside. Heart disease, diabetes, and high blood pressure  High blood pressure causes heart disease and increases the risk of stroke. High blood pressure is more likely to develop in:  People who have blood pressure in the high end of the normal range (130-139/85-89 mm Hg).  People who are overweight or obese.  People who are African American.  If you are 14-56 years of age, have your blood pressure checked every 3-5 years. If you are 58 years of age or older, have your blood pressure checked every year. You should have your blood pressure measured twice-once when you are at a hospital or clinic, and once when you are not at a hospital or clinic. Record the average of the two measurements. To check your blood pressure when you are not at a hospital or clinic, you can use:  An automated blood pressure machine at a pharmacy.  A home blood pressure monitor.  If you are between 79 years and 61 years old, ask your health  care provider if you should take aspirin to prevent strokes.  Have regular diabetes screenings. This involves taking a blood sample to check your fasting blood sugar level.  If you are at a normal weight and have a low risk for diabetes, have this test once every three years after 50 years of age.  If you are overweight and have a high risk for diabetes, consider being tested at a younger age or more often. Preventing infection Hepatitis B  If you have a higher risk for hepatitis B, you should be screened for this virus. You are considered at high risk for hepatitis B if:  You were born in a country where hepatitis B is common. Ask your health care provider which countries are considered high risk.  Your parents were born in a high-risk country, and you have not been immunized against hepatitis B (hepatitis B vaccine).  You have HIV or AIDS.  You use needles to inject street drugs.  You live with someone who has hepatitis B.  You have had sex with someone who has hepatitis B.  You get hemodialysis treatment.  You take certain medicines for conditions, including cancer, organ transplantation, and autoimmune conditions. Hepatitis C  Blood testing is recommended for:  Everyone born from [birth year] through 1964.  Anyone with known risk factors for hepatitis C. Sexually transmitted infections (STIs)  You should be screened for sexually transmitted infections (STIs) including gonorrhea and chlamydia if:  You are sexually active and are younger than 50 years of age.  You are older than 50 years of age and your health care provider tells you that you are at risk for this type of infection.  Your sexual activity has changed since you were last screened and you are at an increased risk for chlamydia or gonorrhea. Ask your health care provider if you are at risk.  If you do not have HIV, but are at risk, it may be recommended that you take a prescription medicine daily to prevent HIV  infection. This is called pre-exposure prophylaxis (PrEP). You are considered at risk if:  You are sexually active and do not regularly use condoms or know the HIV status of your partner(s).  You take drugs by injection.  You are sexually active with a partner who has HIV. Talk with your health care provider about whether you are at high risk of being infected with HIV. If you choose to begin PrEP, you should  first be tested for HIV. You should then be tested every 3 months for as long as you are taking PrEP. Pregnancy  If you are premenopausal and you may become pregnant, ask your health care provider about preconception counseling.  If you may become pregnant, take 400 to 800 micrograms (mcg) of folic acid every day.  If you want to prevent pregnancy, talk to your health care provider about birth control (contraception). Osteoporosis and menopause  Osteoporosis is a disease in which the bones lose minerals and strength with aging. This can result in serious bone fractures. Your risk for osteoporosis can be identified using a bone density scan.  If you are 13 years of age or older, or if you are at risk for osteoporosis and fractures, ask your health care provider if you should be screened.  Ask your health care provider whether you should take a calcium or vitamin D supplement to lower your risk for osteoporosis.  Menopause may have certain physical symptoms and risks.  Hormone replacement therapy may reduce some of these symptoms and risks. Talk to your health care provider about whether hormone replacement therapy is right for you. Follow these instructions at home:  Schedule regular health, dental, and eye exams.  Stay current with your immunizations.  Do not use any tobacco products including cigarettes, chewing tobacco, or electronic cigarettes.  If you are pregnant, do not drink alcohol.  If you are breastfeeding, limit how much and how often you drink alcohol.  Limit  alcohol intake to no more than 1 drink per day for nonpregnant women. One drink equals 12 ounces of beer, 5 ounces of wine, or 1 ounces of hard liquor.  Do not use street drugs.  Do not share needles.  Ask your health care provider for help if you need support or information about quitting drugs.  Tell your health care provider if you often feel depressed.  Tell your health care provider if you have ever been abused or do not feel safe at home. This information is not intended to replace advice given to you by your health care provider. Make sure you discuss any questions you have with your health care provider. Document Released: 12/20/2010 Document Revised: 11/12/2015 Document Reviewed: 03/10/2015 Elsevier Interactive Patient Education  2017 [employer].    IF you received an x-ray today, you will receive an invoice from [facility]. Please contact [facility] at [phone] with questions or concerns regarding your invoice.   IF you received labwork today, you will receive an invoice from [facility]. Please contact [facility] at [phone] with questions or concerns regarding your invoice.   Our billing staff will not be able to assist you with questions regarding bills from these companies.  You will be contacted with the lab results as soon as they are available. The fastest way to get your results is to activate your My Chart account. Instructions are located on the last page of this paperwork. If you have not heard from [place] regarding the results in 2 weeks, please contact this office.

## 2016-09-02 NOTE — Progress Notes (Signed)
[name]  MRN: [id number] DOB: [birth year]  PCP: Elizabeth Sauer  Subjective:  Pt presents to clinic for a CPE.  Last dental exam: every 6 months Last vision exam: wears glasses - yearly Last pap: >1 year - Dr Karlton Lemon Last mammo: [facility] - 06/2016 Last colonoscopy: 2007 - done early due to symptoms Vaccinations- UTD  Pt has some back pain upper and lower and area on her shoulders that are indented due to bra straps.  She has large breasts that are uncomfortable - she is thinking about looking into breast reduction.  Typical meals for patient: 2 meals a day, snacks a lot (junk food) Typical beverage choices: rare coke, daily juice, minimal water Exercises: none Sleeps: has infant -disrupted  5 hrs per night  Patient Active Problem List   Diagnosis Date Noted  . Hypothyroidism 03/11/2010  . ANEMIA-NOS 03/11/2010  . DEPRESSION 03/11/2010  . Migraine headache 03/11/2010  . ASTHMA 03/11/2010  . GERD 03/11/2010    Review of Systems  Constitutional: Negative.  Negative for chills and fever.  HENT: Negative.   Eyes: Negative.   Respiratory: Negative.   Cardiovascular: Negative.   Gastrointestinal: Negative.   Endocrine: Negative.   Genitourinary: Negative.   Musculoskeletal: Negative.   Skin: Negative.   Allergic/Immunologic: Negative.   Neurological: Negative.   Hematological: Negative.   Psychiatric/Behavioral: Negative.      Current Outpatient Prescriptions on File Prior to Visit  Medication Sig Dispense Refill  . Levothyroxine Sodium (SYNTHROID PO) Take 50 mcg by mouth.      No current facility-administered medications on file prior to visit.     Allergies  Allergen Reactions  . Codeine     REACTION: nause and vomiting and pt states feels out of it.    Social History   Social History  . Marital status: Single    Spouse name: N/A  . Number of children: N/A  . Years of education: N/A   Social History Main Topics  . Smoking status: Never  Smoker  . Smokeless tobacco: Never Used  . Alcohol use 0.0 oz/week     Comment: none  . Drug use: No  . Sexual activity: No   Other Topics Concern  . None   Social History Narrative   Fostering to adopt daughter June 2017    Past Surgical History:  Procedure Laterality Date  . MYOMECTOMY    . WISDOM TOOTH EXTRACTION      Family History  Problem Relation Age of Onset  . Cancer Mother 78    breast  . Stroke Mother   . Heart disease Father   . Diabetes Brother   . Heart disease Brother 32    massive MI  . Mental illness Brother   . Diabetes Maternal Grandmother   . Stroke Maternal Grandmother   . Diabetes Maternal Grandfather      Objective:  BP 132/82   Pulse 77   Temp 98.6 F (37 C) (Oral)   Resp 16   Ht '5\' 4"'  (1.626 m)   Wt 190 lb (86.2 kg)   LMP 09/02/2016   SpO2 99%   BMI 32.61 kg/m   Physical Exam  Constitutional: She is oriented to person, place, and time and well-developed, well-nourished, and in no distress.  HENT:  Head: Normocephalic and atraumatic.  Right Ear: Hearing, tympanic membrane, external ear and ear canal normal.  Left Ear: Hearing, tympanic membrane, external ear and ear canal normal.  Nose: Nose normal.  Mouth/Throat: Uvula  is midline, oropharynx is clear and moist and mucous membranes are normal.  Eyes: Conjunctivae and EOM are normal. Pupils are equal, round, and reactive to light.  Neck: Trachea normal and normal range of motion. Neck supple. No thyroid mass and no thyromegaly present.  Cardiovascular: Normal rate, regular rhythm and normal heart sounds.   No murmur heard. Pulmonary/Chest: Effort normal and breath sounds normal. She has no wheezes. Right breast exhibits no inverted nipple, no mass, no nipple discharge, no skin change and no tenderness. Left breast exhibits no inverted nipple, no mass, no nipple discharge, no skin change and no tenderness. Breasts are symmetrical.  Large breasts bilaterally  Abdominal: Soft. Bowel  sounds are normal. There is no tenderness.  Genitourinary: Vagina normal, uterus normal, cervix normal, right adnexa normal, left adnexa normal and vulva normal. No vaginal discharge found.  Musculoskeletal: Normal range of motion.  Lymphadenopathy:    She has no cervical adenopathy.  Neurological: She is alert and oriented to person, place, and time. She has normal motor skills, normal sensation, normal strength and normal reflexes. Gait normal.  Skin: Skin is warm and dry.  Psychiatric: Mood, memory, affect and judgment normal.    Visual Acuity Screening   Right eye Left eye Both eyes  Without correction:     With correction: 20/25 -1 20/20 -1 20/15    Assessment and Plan :  Annual physical exam  Acquired hypothyroidism - Plan: TSH, T4, Free - check labs adjust medication based on lab results  Migraine without aura and without status migrainosus, not intractable - Plan: naproxen (NAPROSYN) 500 MG tablet - pt uses this medication and this helps her headaches - she does not eat pork or drink ETOH as these both causes increased headaches  Screening for deficiency anemia - Plan: CBC with Differential/Platelet  Screening for metabolic disorder - Plan: CMP14+EGFR  Screening, lipid - Plan: Lipid panel  Vitamin D deficiency - Plan: VITAMIN D 25 Hydroxy (Vit-D Deficiency, Fractures) - check labs - pt is currently not on supplementation  Encounter for gynecological examination without abnormal finding - Plan: Pap IG and HPV (high risk) DNA detection  Screen for colon cancer - Plan: Ambulatory referral to Gastroenterology  Chronic bilateral back pain, unspecified back location - likely from breast size  Windell Hummingbird PA-C  [facility] [place] 09/02/2016 11:44 AM

## 2016-09-03 LAB — LIPID PANEL
CHOL/HDL RATIO: 2.8 ratio (ref 0.0–4.4)
CHOLESTEROL TOTAL: 202 mg/dL — AB (ref 100–199)
HDL: 71 mg/dL (ref >39)
LDL CALC: 120 mg/dL — AB (ref 0–99)
Triglycerides: 57 mg/dL (ref 0–149)
VLDL CHOLESTEROL CAL: 11 mg/dL (ref 5–40)

## 2016-09-03 LAB — CMP14+EGFR
ALBUMIN: 4.3 g/dL (ref 3.5–5.5)
ALT: 11 IU/L (ref 0–32)
AST: 17 IU/L (ref 0–40)
Albumin/Globulin Ratio: 1.1 — ABNORMAL LOW (ref 1.2–2.2)
Alkaline Phosphatase: 62 IU/L (ref 39–117)
BUN / CREAT RATIO: 16 (ref 9–23)
BUN: 13 mg/dL (ref 6–24)
Bilirubin Total: 0.5 mg/dL (ref 0.0–1.2)
CO2: 26 mmol/L (ref 18–29)
CREATININE: 0.81 mg/dL (ref 0.57–1.00)
Calcium: 9.5 mg/dL (ref 8.7–10.2)
Chloride: 99 mmol/L (ref 96–106)
GFR, EST AFRICAN AMERICAN: 98 mL/min/{1.73_m2} (ref >59)
GFR, EST NON AFRICAN AMERICAN: 85 mL/min/{1.73_m2} (ref >59)
GLUCOSE: 80 mg/dL (ref 65–99)
Globulin, Total: 3.8 g/dL (ref 1.5–4.5)
Potassium: 3.7 mmol/L (ref 3.5–5.2)
Sodium: 140 mmol/L (ref 134–144)
Total Protein: 8.1 g/dL (ref 6.0–8.5)

## 2016-09-03 LAB — CBC WITH DIFFERENTIAL/PLATELET
BASOS ABS: 0 10*3/uL (ref 0.0–0.2)
Basos: 0 %
EOS (ABSOLUTE): 0.1 10*3/uL (ref 0.0–0.4)
Eos: 1 %
HEMOGLOBIN: 13.7 g/dL (ref 11.1–15.9)
Hematocrit: 41.1 % (ref 34.0–46.6)
IMMATURE GRANS (ABS): 0 10*3/uL (ref 0.0–0.1)
Immature Granulocytes: 0 %
Lymphocytes Absolute: 2.5 10*3/uL (ref 0.7–3.1)
Lymphs: 46 %
MCH: 28.7 pg (ref 26.6–33.0)
MCHC: 33.3 g/dL (ref 31.5–35.7)
MCV: 86 fL (ref 79–97)
Monocytes Absolute: 0.4 10*3/uL (ref 0.1–0.9)
Monocytes: 7 %
NEUTROS PCT: 46 %
Neutrophils Absolute: 2.5 10*3/uL (ref 1.4–7.0)
Platelets: 394 10*3/uL — ABNORMAL HIGH (ref 150–379)
RBC: 4.78 x10E6/uL (ref 3.77–5.28)
RDW: 14.8 % (ref 12.3–15.4)
WBC: 5.4 10*3/uL (ref 3.4–10.8)

## 2016-09-03 LAB — VITAMIN D 25 HYDROXY (VIT D DEFICIENCY, FRACTURES): Vit D, 25-Hydroxy: 31.9 ng/mL (ref 30.0–100.0)

## 2016-09-03 LAB — T4, FREE: FREE T4: 1.21 ng/dL (ref 0.82–1.77)

## 2016-09-03 LAB — TSH: TSH: 0.662 u[IU]/mL (ref 0.450–4.500)

## 2016-09-06 LAB — PAP IG AND HPV HIGH-RISK
HPV, HIGH-RISK: NEGATIVE
PAP SMEAR COMMENT: 0

## 2016-09-12 ENCOUNTER — Telehealth: Payer: Self-pay | Admitting: Physician Assistant

## 2016-09-12 MED ORDER — LEVOTHYROXINE SODIUM 50 MCG PO TABS: 50.0000 ug | ORAL_TABLET | Freq: Every day | ORAL | 1 refills | Status: DC

## 2016-09-12 NOTE — Telephone Encounter (Signed)
PATIENT HAD HER COMPLETE PHYSICAL DONE WITH [facility] ON Friday 09/02/16. SARAH WAS WAITING FOR HER LABS TO COME BACK WHICH THEY DID LAST WEEK. SHE NEEDS TO GET HER MEDICINE FOR HER THYROID CALLED IN AS SOON AS POSSIBLE BECAUSE SHE IS ABOUT TO RUN OUT. SHE WOULD LIKE TO TRY THE GENERIC OR REGULAR OF SYNTHROID IF IT IS NOT TOO EXPENSIVE. BEST PHONE [phone] (CELL) PHARMACY CHOICE IS [facility] ON [address] IT IS RIGHT AROUND THE CORNER FROM [facility]. [place]

## 2016-09-12 NOTE — Telephone Encounter (Signed)
L/M WITH LAB RESULTS AND RX SENT TO PHARMACY

## 2016-10-04 ENCOUNTER — Encounter: Payer: Self-pay | Admitting: Physician Assistant

## 2016-12-30 ENCOUNTER — Telehealth: Payer: Self-pay | Admitting: Physician Assistant

## 2016-12-30 NOTE — Telephone Encounter (Signed)
[place] [employer] ppw has been placed in weber's box for completion.  Pt lost original PE ppw and sent over a new copy to be filled out by Weber.  Please call pt before sending form back through fax.  THIS FAX NUMBER IS NOT A PRIVATE FAX, PLEASE USE COVER SHEET. Thank you! ZOXWR:[phone] BJY:[phone]

## 2017-01-02 ENCOUNTER — Encounter: Payer: Self-pay | Admitting: Physician Assistant

## 2017-01-04 NOTE — Telephone Encounter (Signed)
I sent the patient a mychart message with the TB questions - these need to be asked to fill out her form please.

## 2017-01-09 NOTE — Telephone Encounter (Signed)
Pt completed TB questionnaire via My Chart. Completed form placed up front for pick up per pt request

## 2017-01-10 NOTE — Telephone Encounter (Signed)
The form is in my box - pt does not need a TB - I have already signed - please date and states that she is free from TB due to [facility] guidelines. It is in my box  And may be in a folder.

## 2017-03-21 ENCOUNTER — Telehealth: Payer: Self-pay | Admitting: Physician Assistant

## 2017-03-21 ENCOUNTER — Other Ambulatory Visit: Payer: Self-pay

## 2017-03-21 MED ORDER — LEVOTHYROXINE SODIUM 50 MCG PO TABS: 50.0000 ug | ORAL_TABLET | Freq: Every day | ORAL | 0 refills | Status: DC

## 2017-03-21 NOTE — Telephone Encounter (Signed)
Pt would like a refill on levothyroxine (SYNTHROID) 50 MCG tablet [[id number]]  Please advise: [phone]

## 2017-03-21 NOTE — Telephone Encounter (Signed)
Rx sent in

## 2017-04-05 ENCOUNTER — Ambulatory Visit (INDEPENDENT_AMBULATORY_CARE_PROVIDER_SITE_OTHER): Payer: Federal, State, Local not specified - PPO | Admitting: Family Medicine

## 2017-04-05 VITALS — BP 122/84 | HR 100 | Temp 98.5°F | Resp 17 | Ht 64.0 in | Wt 193.8 lb

## 2017-04-05 DIAGNOSIS — E039 Hypothyroidism, unspecified: Secondary | ICD-10-CM

## 2017-04-05 DIAGNOSIS — J301 Allergic rhinitis due to pollen: Secondary | ICD-10-CM

## 2017-04-05 MED ORDER — CETIRIZINE HCL 10 MG PO CAPS: 1.0000 | ORAL_CAPSULE | Freq: Every day | ORAL | Status: DC

## 2017-04-05 MED ORDER — PREDNISONE 20 MG PO TABS: 40.0000 mg | ORAL_TABLET | Freq: Every day | ORAL | 0 refills | Status: AC

## 2017-04-05 MED ORDER — FLUTICASONE PROPIONATE 50 MCG/ACT NA SUSP: 2.0000 | Freq: Two times a day (BID) | NASAL | 6 refills | Status: DC

## 2017-04-05 NOTE — Progress Notes (Signed)
Chief Complaint  Patient presents with  . Sinusitis    possibly, ha last night, nasal congestion,rn, and cough. Onset: Monday after flying back from a funeral in [place], ? cause due to climate difference.  Some drainage but it is clear, unable to breather and feels drained of energy    HPI   Pt reports sinus congestion and nasal congestion She states that she has been taking zyrtec D She flew Sunday 4 days ago and symptoms intensified 3 days ago She has chills No fevers  She denies wheezing Reports a cough  Her throat is itchy and her cough is non productive She typically gets seasonal allergies She is not using a nasal spray  Hypothyroidism: Patient presents for evaluation of thyroid function. Symptoms consist of denies fatigue, weight changes, heat/cold intolerance, bowel/skin changes or CVS symptoms.    Previous thyroid studies include TSH.  She reports better moods since taking Synthroid brand.   Lab Results  Component Value Date   TSH 0.662 09/02/2016    Past Medical History:  Diagnosis Date  . Allergy   . Anemia   . Depression   . Fibroids   . Frequent headaches   . Thyroid disease     Current Outpatient Prescriptions  Medication Sig Dispense Refill  . levothyroxine (SYNTHROID) 50 MCG tablet Take 1 tablet (50 mcg total) by mouth daily before breakfast. Needs office visit 30 tablet 0  . b complex vitamins capsule Take 1 capsule by mouth daily.    . Cetirizine HCl (ZYRTEC ALLERGY) 10 MG CAPS Take 1 capsule (10 mg total) by mouth daily. 30 capsule   . Cholecalciferol (VITAMIN D PO) Take by mouth.    . fluticasone (FLONASE) 50 MCG/ACT nasal spray Place 2 sprays into both nostrils 2 (two) times daily. 16 g 6  . naproxen (NAPROSYN) 500 MG tablet Take 1 tablet (500 mg total) by mouth 2 (two) times daily with a meal. (Patient not taking: Reported on 04/05/2017) 90 tablet 1  . predniSONE (DELTASONE) 20 MG tablet Take 2 tablets (40 mg total) by mouth daily with breakfast.  6 tablet 0   No current facility-administered medications for this visit.     Allergies:  Allergies  Allergen Reactions  . Codeine     REACTION: nause and vomiting and pt states feels out of it.    Past Surgical History:  Procedure Laterality Date  . MYOMECTOMY    . WISDOM TOOTH EXTRACTION      Social History   Social History  . Marital status: Single    Spouse name: N/A  . Number of children: N/A  . Years of education: N/A   Social History Main Topics  . Smoking status: Never Smoker  . Smokeless tobacco: Never Used  . Alcohol use 0.0 oz/week     Comment: none  . Drug use: No  . Sexual activity: No   Other Topics Concern  . Not on file   Social History Narrative   Fostering to adopt daughter June 2017    ROS  Objective: Vitals:   04/05/17 1136  BP: 122/84  Pulse: 100  Resp: 17  Temp: 98.5 F (36.9 C)  TempSrc: Oral  SpO2: 94%  Weight: 193 lb 12.8 oz (87.9 kg)  Height: 5\' 4"  (1.626 m)    Physical Exam General: alert, oriented, in NAD Head: normocephalic, atraumatic, no sinus tenderness Eyes: EOM intact, no scleral icterus or conjunctival injection Ears: TM clear bilaterally Nose: mucosa erythematous and edematous, right nare blocked  Throat: no pharyngeal exudate or erythema Lymph: no posterior auricular, submental or cervical lymph adenopathy Heart: normal rate, normal sinus rhythm, no murmurs Lungs: clear to auscultation bilaterally, no wheezing    Assessment and Plan Twala was seen today for sinusitis.  Diagnoses and all orders for this visit:  Non-seasonal allergic rhinitis due to pollen- will give zyrtec and flonase Discussed prednisone if symptoms progress  Short course  Acquired hypothyroidism- due for repeat labs -     TSH  Other orders -     fluticasone (FLONASE) 50 MCG/ACT nasal spray; Place 2 sprays into both nostrils 2 (two) times daily. -     Cetirizine HCl (ZYRTEC ALLERGY) 10 MG CAPS; Take 1 capsule (10 mg total) by  mouth daily. -     predniSONE (DELTASONE) 20 MG tablet; Take 2 tablets (40 mg total) by mouth daily with breakfast.     Shanara Schnieders A Nolon Rod

## 2017-04-05 NOTE — Patient Instructions (Addendum)
1. Use flonase twice a day by sniffing the mist  2. Continue zyrtec D 3. Drink plenty of water 4. If there is no improvement in one week then take prednisone 3 day burst    IF you received an x-ray today, you will receive an invoice from [facility]. Please contact [facility] at [phone] with questions or concerns regarding your invoice.   IF you received labwork today, you will receive an invoice from [facility]. Please contact [facility] at [phone] with questions or concerns regarding your invoice.   Our billing staff will not be able to assist you with questions regarding bills from these companies.  You will be contacted with the lab results as soon as they are available. The fastest way to get your results is to activate your My Chart account. Instructions are located on the last page of this paperwork. If you have not heard from [place] regarding the results in 2 weeks, please contact this office.    Allergic Rhinitis Allergic rhinitis is when the mucous membranes in the nose respond to allergens. Allergens are particles in the air that cause your body to have an allergic reaction. This causes you to release allergic antibodies. Through a chain of events, these eventually cause you to release histamine into the blood stream. Although meant to protect the body, it is this release of histamine that causes your discomfort, such as frequent sneezing, congestion, and an itchy, runny nose. What are the causes? Seasonal allergic rhinitis (hay fever) is caused by pollen allergens that may come from grasses, trees, and weeds. Year-round allergic rhinitis (perennial allergic rhinitis) is caused by allergens such as house dust mites, pet dander, and mold spores. What are the signs or symptoms?  Nasal stuffiness (congestion).  Itchy, runny nose with sneezing and tearing of the eyes. How is this diagnosed? Your health care provider can help you determine the allergen or  allergens that trigger your symptoms. If you and your health care provider are unable to determine the allergen, skin or blood testing may be used. Your health care provider will diagnose your condition after taking your health history and performing a physical exam. Your health care provider may assess you for other related conditions, such as asthma, pink eye, or an ear infection. How is this treated? Allergic rhinitis does not have a cure, but it can be controlled by:  Medicines that block allergy symptoms. These may include allergy shots, nasal sprays, and oral antihistamines.  Avoiding the allergen.  Hay fever may often be treated with antihistamines in pill or nasal spray forms. Antihistamines block the effects of histamine. There are over-the-counter medicines that may help with nasal congestion and swelling around the eyes. Check with your health care provider before taking or giving this medicine. If avoiding the allergen or the medicine prescribed do not work, there are many new medicines your health care provider can prescribe. Stronger medicine may be used if initial measures are ineffective. Desensitizing injections can be used if medicine and avoidance does not work. Desensitization is when a patient is given ongoing shots until the body becomes less sensitive to the allergen. Make sure you follow up with your health care provider if problems continue. Follow these instructions at home: It is not possible to completely avoid allergens, but you can reduce your symptoms by taking steps to limit your exposure to them. It helps to know exactly what you are allergic to so that you can avoid your specific triggers. Contact a health care  provider if:  You have a fever.  You develop a cough that does not stop easily (persistent).  You have shortness of breath.  You start wheezing.  Symptoms interfere with normal daily activities. This information is not intended to replace advice given  to you by your health care provider. Make sure you discuss any questions you have with your health care provider. Document Released: 03/01/2001 Document Revised: 02/05/2016 Document Reviewed: 02/11/2013 Elsevier Interactive Patient Education  2017 [employer].

## 2017-04-06 LAB — TSH: TSH: 0.677 u[IU]/mL (ref 0.450–4.500)

## 2017-04-06 MED ORDER — LEVOTHYROXINE SODIUM 50 MCG PO TABS: 50.0000 ug | ORAL_TABLET | Freq: Every day | ORAL | 1 refills | Status: DC

## 2017-04-06 NOTE — Addendum Note (Signed)
Addended by: Delia Chimes A on: 04/06/2017 10:39 AM   Modules accepted: Orders

## 2017-04-13 ENCOUNTER — Ambulatory Visit (INDEPENDENT_AMBULATORY_CARE_PROVIDER_SITE_OTHER): Payer: Federal, State, Local not specified - PPO | Admitting: Family Medicine

## 2017-04-13 ENCOUNTER — Encounter: Payer: Self-pay | Admitting: Family Medicine

## 2017-04-13 VITALS — BP 120/72 | HR 104 | Temp 98.3°F | Resp 18 | Ht 64.0 in | Wt 194.0 lb

## 2017-04-13 DIAGNOSIS — J029 Acute pharyngitis, unspecified: Secondary | ICD-10-CM | POA: Diagnosis not present

## 2017-04-13 DIAGNOSIS — J301 Allergic rhinitis due to pollen: Secondary | ICD-10-CM | POA: Diagnosis not present

## 2017-04-13 MED ORDER — MAGIC MOUTHWASH W/LIDOCAINE: ORAL | 0 refills | Status: DC

## 2017-04-13 NOTE — Patient Instructions (Addendum)
[name] songs      IF you received an x-ray today, you will receive an invoice from [facility]. Please contact [facility] at [phone] with questions or concerns regarding your invoice.   IF you received labwork today, you will receive an invoice from [facility]. Please contact [facility] at [phone] with questions or concerns regarding your invoice.   Our billing staff will not be able to assist you with questions regarding bills from these companies.  You will be contacted with the lab results as soon as they are available. The fastest way to get your results is to activate your My Chart account. Instructions are located on the last page of this paperwork. If you have not heard from [place] regarding the results in 2 weeks, please contact this office.     Sore Throat A sore throat is pain, burning, irritation, or scratchiness in the throat. When you have a sore throat, you may feel pain or tenderness in your throat when you swallow or talk. Many things can cause a sore throat, including:  An infection.  Seasonal allergies.  Dryness in the air.  Irritants, such as smoke or pollution.  Gastroesophageal reflux disease (GERD).  A tumor.  A sore throat is often the first sign of another sickness. It may happen with other symptoms, such as coughing, sneezing, fever, and swollen neck glands. Most sore throats go away without medical treatment. Follow these instructions at home:  Take over-the-counter medicines only as told by your health care provider.  Drink enough fluids to keep your urine clear or pale yellow.  Rest as needed.  To help with pain, try: ? Sipping warm liquids, such as broth, herbal tea, or warm water. ? Eating or drinking cold or frozen liquids, such as frozen ice pops. ? Gargling with a salt-water mixture 3-4 times a day or as needed. To make a salt-water mixture, completely dissolve -1 tsp of salt in 1 cup of warm water. ? Sucking  on hard candy or throat lozenges. ? Putting a cool-mist humidifier in your bedroom at night to moisten the air. ? Sitting in the bathroom with the door closed for 5-10 minutes while you run hot water in the shower.  Do not use any tobacco products, such as cigarettes, chewing tobacco, and e-cigarettes. If you need help quitting, ask your health care provider. Contact a health care provider if:  You have a fever for more than 2-3 days.  You have symptoms that last (are persistent) for more than 2-3 days.  Your throat does not get better within 7 days.  You have a fever and your symptoms suddenly get worse. Get help right away if:  You have difficulty breathing.  You cannot swallow fluids, soft foods, or your saliva.  You have increased swelling in your throat or neck.  You have persistent nausea and vomiting. This information is not intended to replace advice given to you by your health care provider. Make sure you discuss any questions you have with your health care provider. Document Released: 07/14/2004 Document Revised: 01/31/2016 Document Reviewed: 03/27/2015 Elsevier Interactive Patient Education  [employer].

## 2017-04-13 NOTE — Progress Notes (Signed)
Chief Complaint  Patient presents with  . Follow-up    allergic rhinitis, per pt she hasn't got better throat is on fire, left ear blockage, and unable to stop coughing.  Has not taken anything for the cough    HPI   Pt reports throat pain Nasal congestion and drainage She also has itching in the throat with burning Denies myalgias No fevers or chills No swollen glands No headaches She is taking zyrtec D for her allergic rhinitis She is also coughing up phlegm She denies chest pains but feels soreness in her chest  She reports that her cough has gotten worse since 04/01/2017    Past Medical History:  Diagnosis Date  . Allergy   . Anemia   . Depression   . Fibroids   . Frequent headaches   . Thyroid disease     Current Outpatient Prescriptions  Medication Sig Dispense Refill  . b complex vitamins capsule Take 1 capsule by mouth daily.    Marland Kitchen levothyroxine (SYNTHROID) 50 MCG tablet Take 1 tablet (50 mcg total) by mouth daily before breakfast. 90 tablet 1  . Cetirizine HCl (ZYRTEC ALLERGY) 10 MG CAPS Take 1 capsule (10 mg total) by mouth daily. (Patient not taking: Reported on 04/13/2017) 30 capsule   . Cholecalciferol (VITAMIN D PO) Take by mouth.    . fluticasone (FLONASE) 50 MCG/ACT nasal spray Place 2 sprays into both nostrils 2 (two) times daily. (Patient not taking: Reported on 04/13/2017) 16 g 6  . magic mouthwash w/lidocaine SOLN Take 70ml swish and swallow no more than every 4 hours as needed 100 mL 0  . naproxen (NAPROSYN) 500 MG tablet Take 1 tablet (500 mg total) by mouth 2 (two) times daily with a meal. (Patient not taking: Reported on 04/05/2017) 90 tablet 1   No current facility-administered medications for this visit.     Allergies:  Allergies  Allergen Reactions  . Codeine     REACTION: nause and vomiting and pt states feels out of it.    Past Surgical History:  Procedure Laterality Date  . MYOMECTOMY    . WISDOM TOOTH EXTRACTION      Social  History   Social History  . Marital status: Single    Spouse name: N/A  . Number of children: N/A  . Years of education: N/A   Social History Main Topics  . Smoking status: Never Smoker  . Smokeless tobacco: Never Used  . Alcohol use 0.0 oz/week     Comment: none  . Drug use: No  . Sexual activity: No   Other Topics Concern  . None   Social History Narrative   Fostering to adopt daughter June 2017    Family History  Problem Relation Age of Onset  . Cancer Mother 35       breast  . Stroke Mother   . Heart disease Father   . Diabetes Brother   . Heart disease Brother 34       massive MI  . Mental illness Brother   . Diabetes Maternal Grandmother   . Stroke Maternal Grandmother   . Diabetes Maternal Grandfather      ROS Review of Systems See HPI Constitution: No fevers or chills No malaise No diaphoresis Skin: No rash or itching Eyes: no blurry vision, no double vision GU: no dysuria or hematuria Neuro: no dizziness or headaches  Objective: Vitals:   04/13/17 1410  BP: 120/72  Pulse: (!) 104  Resp: 18  Temp: 98.3  F (36.8 C)  TempSrc: Oral  SpO2: 98%  Weight: 194 lb (88 kg)  Height: 5\' 4"  (1.626 m)    Physical Exam General: alert, oriented, in NAD Head: normocephalic, atraumatic, no sinus tenderness Eyes: EOM intact, no scleral icterus or conjunctival injection Ears: TM clear bilaterally Nose: mucosa nonerythematous, nonedematous Throat: no pharyngeal exudate or erythema Lymph: no posterior auricular, submental or cervical lymph adenopathy Heart: normal rate, normal sinus rhythm, no murmurs Lungs: clear to auscultation bilaterally, no wheezing   Assessment and Plan [name] was seen today for follow-up.  Diagnoses and all orders for this visit:  Non-seasonal allergic rhinitis due to pollen Sore throat  Advised continued flonase and magic mouthwash -     magic mouthwash w/lidocaine SOLN; Take 33ml swish and swallow no more than every 4  hours as needed     [place]

## 2017-05-24 ENCOUNTER — Ambulatory Visit: Payer: Federal, State, Local not specified - PPO | Admitting: Family Medicine

## 2017-05-24 NOTE — Progress Notes (Deleted)
  No chief complaint on file.   HPI  4 review of systems  Past Medical History:  Diagnosis Date  . Allergy   . Anemia   . Depression   . Fibroids   . Frequent headaches   . Thyroid disease     Current Outpatient Medications  Medication Sig Dispense Refill  . b complex vitamins capsule Take 1 capsule by mouth daily.    . Cholecalciferol (VITAMIN D PO) Take by mouth.    . levothyroxine (SYNTHROID) 50 MCG tablet Take 1 tablet (50 mcg total) by mouth daily before breakfast. 90 tablet 1  . magic mouthwash w/lidocaine SOLN Take 11ml swish and swallow no more than every 4 hours as needed 100 mL 0   No current facility-administered medications for this visit.     Allergies:  Allergies  Allergen Reactions  . Codeine     REACTION: nause and vomiting and pt states feels out of it.    Past Surgical History:  Procedure Laterality Date  . MYOMECTOMY    . WISDOM TOOTH EXTRACTION      Social History   Socioeconomic History  . Marital status: Single    Spouse name: Not on file  . Number of children: Not on file  . Years of education: Not on file  . Highest education level: Not on file  Social Needs  . Financial resource strain: Not on file  . Food insecurity - worry: Not on file  . Food insecurity - inability: Not on file  . Transportation needs - medical: Not on file  . Transportation needs - non-medical: Not on file  Occupational History  . Not on file  Tobacco Use  . Smoking status: Never Smoker  . Smokeless tobacco: Never Used  Substance and Sexual Activity  . Alcohol use: Yes    Alcohol/week: 0.0 oz    Comment: none  . Drug use: No  . Sexual activity: No  Other Topics Concern  . Not on file  Social History Narrative   Fostering to adopt daughter June 2017    Family History  Problem Relation Age of Onset  . Cancer Mother 34       breast  . Stroke Mother   . Heart disease Father   . Diabetes Brother   . Heart disease Brother 14       massive MI  .  Mental illness Brother   . Diabetes Maternal Grandmother   . Stroke Maternal Grandmother   . Diabetes Maternal Grandfather      ROS Review of Systems See HPI Constitution: No fevers or chills No malaise No diaphoresis Skin: No rash or itching Eyes: no blurry vision, no double vision GU: no dysuria or hematuria Neuro: no dizziness or headaches * all others reviewed and negative   Objective: There were no vitals filed for this visit.  Physical Exam  Assessment and Plan There are no diagnoses linked to this encounter.   Rosalia Mcavoy P [employer]

## 2017-05-29 ENCOUNTER — Ambulatory Visit: Payer: Federal, State, Local not specified - PPO | Admitting: Family Medicine

## 2017-06-01 ENCOUNTER — Encounter: Payer: Self-pay | Admitting: Family Medicine

## 2017-06-01 ENCOUNTER — Other Ambulatory Visit: Payer: Self-pay

## 2017-06-01 ENCOUNTER — Ambulatory Visit: Payer: Federal, State, Local not specified - PPO | Admitting: Family Medicine

## 2017-06-01 VITALS — BP 130/102 | HR 92 | Temp 99.0°F | Resp 16 | Ht 64.0 in | Wt 192.0 lb

## 2017-06-01 DIAGNOSIS — G43109 Migraine with aura, not intractable, without status migrainosus: Secondary | ICD-10-CM | POA: Diagnosis not present

## 2017-06-01 DIAGNOSIS — F430 Acute stress reaction: Secondary | ICD-10-CM

## 2017-06-01 MED ORDER — SUMATRIPTAN SUCCINATE 25 MG PO TABS: 25.0000 mg | ORAL_TABLET | ORAL | 0 refills | Status: DC | PRN

## 2017-06-01 MED ORDER — ALPRAZOLAM 0.25 MG PO TABS: 0.2500 mg | ORAL_TABLET | Freq: Two times a day (BID) | ORAL | 0 refills | Status: DC | PRN

## 2017-06-01 NOTE — Progress Notes (Signed)
Chief Complaint  Patient presents with  . migraines and chest tightness    anxiety and stress from job and management.  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     Past Medical History:  Diagnosis Date  . Allergy   . Anemia   . Depression   . Fibroids   . Frequent headaches   . Thyroid disease     Current Outpatient Medications  Medication Sig Dispense Refill  . levothyroxine (SYNTHROID) 50 MCG tablet Take 1 tablet (50 mcg total) by mouth daily before breakfast. 90 tablet 1  . ALPRAZolam (XANAX) 0.25 MG tablet Take 1 tablet (0.25 mg total) by mouth 2 (two) times daily as needed for anxiety (panic attacks). 20 tablet 0  . b complex vitamins capsule Take 1 capsule by mouth daily.    . Cholecalciferol (VITAMIN D PO) Take by mouth.    . magic mouthwash w/lidocaine SOLN Take 22ml swish and swallow no more than every 4 hours as needed 100 mL 0  . SUMAtriptan (IMITREX) 25 MG tablet Take 1 tablet (25 mg total) by mouth every 2 (two) hours as needed for migraine. May repeat in 2 hours if headache persists or recurs. 10 tablet 0   No current facility-administered medications for this visit.     Allergies:  Allergies  Allergen Reactions  . Codeine     REACTION: nause and vomiting and pt states feels out of it.    Past Surgical History:  Procedure Laterality Date  . MYOMECTOMY    . WISDOM TOOTH EXTRACTION        Social History   Socioeconomic History  . Marital status: Single    Spouse name: None  . Number of children: None  . Years of education: None  . Highest education level: None  Social Needs  . Financial resource strain: None  . Food insecurity - worry: None  . Food insecurity - inability: None  . Transportation needs - medical: None  . Transportation needs - non-medical: None  Occupational History  . None  Tobacco Use  . Smoking status: Never Smoker  . Smokeless tobacco: Never Used  Substance and Sexual Activity  . Alcohol use: Yes    Alcohol/week: 0.0 oz    Comment: none  . Drug use: No  . Sexual activity: No  Other Topics Concern  . None  Social History Narrative   Fostering to adopt daughter June 2017    Family History  Problem Relation Age of Onset  . Cancer Mother 60       breast  . Stroke Mother   . Heart disease Father   . Diabetes Brother   . Heart disease Brother 2       massive MI  . Mental illness Brother   . Diabetes Maternal Grandmother   . Stroke Maternal  Grandmother   . Diabetes Maternal Grandfather      ROS Review of Systems See HPI Constitution: No fevers or chills No malaise No diaphoresis Skin: No rash or itching Eyes: no blurry vision, no double vision GU: no dysuria or hematuria Neuro: no dizziness or headaches  all others reviewed and negative   Objective: Vitals:   06/01/17 1632  BP: (!) 130/102  Pulse: 92  Resp: 16  Temp: 99 F (37.2 C)  TempSrc: Oral  SpO2: 100%  Weight: 192 lb (87.1 kg)  Height: 5\' 4"  (1.626 m)    Physical Exam  Constitutional: She appears well-developed and well-nourished.  HENT:  Head: Normocephalic and atraumatic.  Pulmonary/Chest: Effort normal.  Psychiatric: Her speech is normal and behavior is normal. Her mood appears anxious. Cognition and memory are normal.  Tearful affect    Assessment and Plan Azaiah was seen today for migraines and chest tightness.  Diagnoses and all orders  for this visit:  Stress reaction Migraine with aura and without status migrainosus, not intractable Completed letter for work Patient with aggravation of her migraines due to stress from her work environment Discussed stress and its impact on sleep as well as provoking tension headaches which then go on to become migraines Continue excedrin HA Pt planning a transfer to a different location to help with the underlying problem of stress on the job Other orders -     ALPRAZolam (XANAX) 0.25 MG tablet; Take 1 tablet (0.25 mg total) by mouth 2 (two) times daily as needed for anxiety (panic attacks). -     SUMAtriptan (IMITREX) 25 MG tablet; Take 1 tablet (25 mg total) by mouth every 2 (two) hours as needed for migraine. May repeat in 2 hours if headache persists or recurs.   A total of 25 minutes were spent face-to-face with the patient during this encounter and over half of that time was spent on counseling and coordination of care. 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

## 2017-06-01 NOTE — Patient Instructions (Addendum)
Take imitrex for migraines as instructed Also for panic attacks start with a half tablet of xanax at bedtime     IF you received an x-ray today, you will receive an invoice from [facility]. Please contact [facility] at [phone] with questions or concerns regarding your invoice.   IF you received labwork today, you will receive an invoice from [facility]. Please contact [facility] at [phone] with questions or concerns regarding your invoice.   Our billing staff will not be able to assist you with questions regarding bills from these companies.  You will be contacted with the lab results as soon as they are available. The fastest way to get your results is to activate your My Chart account. Instructions are located on the last page of this paperwork. If you have not heard from [place] regarding the results in 2 weeks, please contact this office.     Stress and Stress Management Stress is a normal reaction to life events. It is what you feel when life demands more than you are used to or more than you can handle. Some stress can be useful. For example, the stress reaction can help you catch the last bus of the day, study for a test, or meet a deadline at work. But stress that occurs too often or for too long can cause problems. It can affect your emotional health and interfere with relationships and normal daily activities. Too much stress can weaken your immune system and increase your risk for physical illness. If you already have a medical problem, stress can make it worse. What are the causes? All sorts of life events may cause stress. An event that causes stress for one person may not be stressful for another person. Major life events commonly cause stress. These may be positive or negative. Examples include losing your job, moving into a new home, getting married, having a baby, or losing a loved one. Less obvious life events may also cause stress, especially if they occur  day after day or in combination. Examples include working long hours, driving in traffic, caring for children, being in debt, or being in a difficult relationship. What are the signs or symptoms? Stress may cause emotional symptoms including, the following:  Anxiety. This is feeling worried, afraid, on edge, overwhelmed, or out of control.  Anger. This is feeling irritated or impatient.  Depression. This is feeling sad, down, helpless, or guilty.  Difficulty focusing, remembering, or making decisions.  Stress may cause physical symptoms, including the following:  Aches and pains. These may affect your head, neck, back, stomach, or other areas of your body.  Tight muscles or clenched jaw.  Low energy or trouble sleeping.  Stress may cause unhealthy behaviors, including the following:  Eating to feel better (overeating) or skipping meals.  Sleeping too little, too much, or both.  Working too much or putting off tasks (procrastination).  Smoking, drinking alcohol, or using drugs to feel better.  How is this diagnosed? Stress is diagnosed through an assessment by your health care provider. Your health care provider will ask questions about your symptoms and any stressful life events.Your health care provider will also ask about your medical history and may order blood tests or other tests. Certain medical conditions and medicine can cause physical symptoms similar to stress. Mental illness can cause emotional symptoms and unhealthy behaviors similar to stress. Your health care provider may refer you to a mental health professional for further evaluation. How is this treated? Stress management  is the recommended treatment for stress.The goals of stress management are reducing stressful life events and coping with stress in healthy ways. Techniques for reducing stressful life events include the following:  Stress identification. Self-monitor for stress and identify what causes stress  for you. These skills may help you to avoid some stressful events.  Time management. Set your priorities, keep a calendar of events, and learn to say "no." These tools can help you avoid making too many commitments.  Techniques for coping with stress include the following:  Rethinking the problem. Try to think realistically about stressful events rather than ignoring them or overreacting. Try to find the positives in a stressful situation rather than focusing on the negatives.  Exercise. Physical exercise can release both physical and emotional tension. The key is to find a form of exercise you enjoy and do it regularly.  Relaxation techniques. These relax the body and mind. Examples include yoga, meditation, tai chi, biofeedback, deep breathing, progressive muscle relaxation, listening to music, being out in nature, journaling, and other hobbies. Again, the key is to find one or more that you enjoy and can do regularly.  Healthy lifestyle. Eat a balanced diet, get plenty of sleep, and do not smoke. Avoid using alcohol or drugs to relax.  Strong support network. Spend time with family, friends, or other people you enjoy being around.Express your feelings and talk things over with someone you trust.  Counseling or talktherapy with a mental health professional may be helpful if you are having difficulty managing stress on your own. Medicine is typically not recommended for the treatment of stress.Talk to your health care provider if you think you need medicine for symptoms of stress. Follow these instructions at home:  Keep all follow-up visits as directed by your health care provider.  Take all medicines as directed by your health care provider. Contact a health care provider if:  Your symptoms get worse or you start having new symptoms.  You feel overwhelmed by your problems and can no longer manage them on your own. Get help right away if:  You feel like hurting yourself or someone  else. This information is not intended to replace advice given to you by your health care provider. Make sure you discuss any questions you have with your health care provider. Document Released: 11/30/2000 Document Revised: 11/12/2015 Document Reviewed: 01/29/2013 Elsevier Interactive Patient Education  2017 [employer].

## 2017-06-17 ENCOUNTER — Ambulatory Visit: Payer: Federal, State, Local not specified - PPO | Admitting: Family Medicine

## 2017-07-20 DIAGNOSIS — F331 Major depressive disorder, recurrent, moderate: Secondary | ICD-10-CM | POA: Diagnosis not present

## 2017-08-05 ENCOUNTER — Telehealth: Payer: Self-pay | Admitting: Family Medicine

## 2017-08-05 ENCOUNTER — Ambulatory Visit: Payer: Federal, State, Local not specified - PPO | Admitting: Family Medicine

## 2017-08-05 MED ORDER — SUMATRIPTAN SUCCINATE 50 MG PO TABS: 50.0000 mg | ORAL_TABLET | ORAL | 3 refills | Status: DC | PRN

## 2017-08-05 NOTE — Progress Notes (Deleted)
  No chief complaint on file.   HPI  4 review of systems  Past Medical History:  Diagnosis Date  . Allergy   . Anemia   . Depression   . Fibroids   . Frequent headaches   . Thyroid disease     Current Outpatient Medications  Medication Sig Dispense Refill  . ALPRAZolam (XANAX) 0.25 MG tablet Take 1 tablet (0.25 mg total) by mouth 2 (two) times daily as needed for anxiety (panic attacks). 20 tablet 0  . b complex vitamins capsule Take 1 capsule by mouth daily.    . Cholecalciferol (VITAMIN D PO) Take by mouth.    . levothyroxine (SYNTHROID) 50 MCG tablet Take 1 tablet (50 mcg total) by mouth daily before breakfast. 90 tablet 1  . magic mouthwash w/lidocaine SOLN Take 46ml swish and swallow no more than every 4 hours as needed 100 mL 0  . SUMAtriptan (IMITREX) 25 MG tablet Take 1 tablet (25 mg total) by mouth every 2 (two) hours as needed for migraine. May repeat in 2 hours if headache persists or recurs. 10 tablet 0   No current facility-administered medications for this visit.     Allergies:  Allergies  Allergen Reactions  . Codeine     REACTION: nause and vomiting and pt states feels out of it.    Past Surgical History:  Procedure Laterality Date  . MYOMECTOMY    . WISDOM TOOTH EXTRACTION      Social History   Socioeconomic History  . Marital status: Single    Spouse name: Not on file  . Number of children: Not on file  . Years of education: Not on file  . Highest education level: Not on file  Social Needs  . Financial resource strain: Not on file  . Food insecurity - worry: Not on file  . Food insecurity - inability: Not on file  . Transportation needs - medical: Not on file  . Transportation needs - non-medical: Not on file  Occupational History  . Not on file  Tobacco Use  . Smoking status: Never Smoker  . Smokeless tobacco: Never Used  Substance and Sexual Activity  . Alcohol use: Yes    Alcohol/week: 0.0 oz    Comment: none  . Drug use: No  .  Sexual activity: No  Other Topics Concern  . Not on file  Social History Narrative   Fostering to adopt daughter June 2017    Family History  Problem Relation Age of Onset  . Cancer Mother 9       breast  . Stroke Mother   . Heart disease Father   . Diabetes Brother   . Heart disease Brother 2       massive MI  . Mental illness Brother   . Diabetes Maternal Grandmother   . Stroke Maternal Grandmother   . Diabetes Maternal Grandfather      ROS Review of Systems See HPI Constitution: No fevers or chills No malaise No diaphoresis Skin: No rash or itching Eyes: no blurry vision, no double vision GU: no dysuria or hematuria Neuro: no dizziness or headaches * all others reviewed and negative   Objective: There were no vitals filed for this visit.  Physical Exam  Assessment and Plan There are no diagnoses linked to this encounter.   Armani Brar P [facility]

## 2017-08-05 NOTE — Telephone Encounter (Signed)
Patient needs her Imitrex refilled states she only has one pill left. Also states that she wanted to know if Dr Nolon Rod would go up on the MG because it works sometimes and sometimes it does not. She does have an apt with Dr Nolon Rod on March 2.  [facility] uses the [facility] on [address] and her call back number is [phone]

## 2017-08-05 NOTE — Telephone Encounter (Signed)
Message sent to Dr. Nolon Rod re: Imitrex.

## 2017-08-05 NOTE — Telephone Encounter (Signed)
Refilled sent in

## 2017-08-19 ENCOUNTER — Ambulatory Visit: Payer: Federal, State, Local not specified - PPO | Admitting: Family Medicine

## 2017-08-19 NOTE — Progress Notes (Deleted)
  No chief complaint on file.   HPI  4 review of systems  Past Medical History:  Diagnosis Date  . Allergy   . Anemia   . Depression   . Fibroids   . Frequent headaches   . Thyroid disease     Current Outpatient Medications  Medication Sig Dispense Refill  . ALPRAZolam (XANAX) 0.25 MG tablet Take 1 tablet (0.25 mg total) by mouth 2 (two) times daily as needed for anxiety (panic attacks). 20 tablet 0  . b complex vitamins capsule Take 1 capsule by mouth daily.    . Cholecalciferol (VITAMIN D PO) Take by mouth.    . levothyroxine (SYNTHROID) 50 MCG tablet Take 1 tablet (50 mcg total) by mouth daily before breakfast. 90 tablet 1  . magic mouthwash w/lidocaine SOLN Take 58ml swish and swallow no more than every 4 hours as needed 100 mL 0  . SUMAtriptan (IMITREX) 50 MG tablet Take 1 tablet (50 mg total) by mouth every 2 (two) hours as needed for migraine. May repeat in 2 hours if headache persists or recurs. 30 tablet 3   No current facility-administered medications for this visit.     Allergies:  Allergies  Allergen Reactions  . Codeine     REACTION: nause and vomiting and pt states feels out of it.    Past Surgical History:  Procedure Laterality Date  . MYOMECTOMY    . WISDOM TOOTH EXTRACTION      Social History   Socioeconomic History  . Marital status: Single    Spouse name: Not on file  . Number of children: Not on file  . Years of education: Not on file  . Highest education level: Not on file  Social Needs  . Financial resource strain: Not on file  . Food insecurity - worry: Not on file  . Food insecurity - inability: Not on file  . Transportation needs - medical: Not on file  . Transportation needs - non-medical: Not on file  Occupational History  . Not on file  Tobacco Use  . Smoking status: Never Smoker  . Smokeless tobacco: Never Used  Substance and Sexual Activity  . Alcohol use: Yes    Alcohol/week: 0.0 oz    Comment: none  . Drug use: No  .  Sexual activity: No  Other Topics Concern  . Not on file  Social History Narrative   Fostering to adopt daughter June 2017    Family History  Problem Relation Age of Onset  . Cancer Mother 33       breast  . Stroke Mother   . Heart disease Father   . Diabetes Brother   . Heart disease Brother 54       massive MI  . Mental illness Brother   . Diabetes Maternal Grandmother   . Stroke Maternal Grandmother   . Diabetes Maternal Grandfather      ROS Review of Systems See HPI Constitution: No fevers or chills No malaise No diaphoresis Skin: No rash or itching Eyes: no blurry vision, no double vision GU: no dysuria or hematuria Neuro: no dizziness or headaches * all others reviewed and negative   Objective: There were no vitals filed for this visit.  Physical Exam  Assessment and Plan There are no diagnoses linked to this encounter.   Philis Doke P [facility]

## 2017-08-29 ENCOUNTER — Ambulatory Visit: Payer: Federal, State, Local not specified - PPO | Admitting: Physician Assistant

## 2017-09-14 ENCOUNTER — Ambulatory Visit: Payer: Federal, State, Local not specified - PPO | Admitting: Family Medicine

## 2017-09-14 NOTE — Progress Notes (Deleted)
No chief complaint on file.   HPI  4 review of systems  Past Medical History:  Diagnosis Date  . Allergy   . Anemia   . Depression   . Fibroids   . Frequent headaches   . Thyroid disease     Current Outpatient Medications  Medication Sig Dispense Refill  . ALPRAZolam (XANAX) 0.25 MG tablet Take 1 tablet (0.25 mg total) by mouth 2 (two) times daily as needed for anxiety (panic attacks). 20 tablet 0  . b complex vitamins capsule Take 1 capsule by mouth daily.    . Cholecalciferol (VITAMIN D PO) Take by mouth.    . levothyroxine (SYNTHROID) 50 MCG tablet Take 1 tablet (50 mcg total) by mouth daily before breakfast. 90 tablet 1  . magic mouthwash w/lidocaine SOLN Take 79ml swish and swallow no more than every 4 hours as needed 100 mL 0  . SUMAtriptan (IMITREX) 50 MG tablet Take 1 tablet (50 mg total) by mouth every 2 (two) hours as needed for migraine. May repeat in 2 hours if headache persists or recurs. 30 tablet 3   No current facility-administered medications for this visit.     Allergies:  Allergies  Allergen Reactions  . Codeine     REACTION: nause and vomiting and pt states feels out of it.    Past Surgical History:  Procedure Laterality Date  . MYOMECTOMY    . WISDOM TOOTH EXTRACTION      Social History   Socioeconomic History  . Marital status: Single    Spouse name: Not on file  . Number of children: Not on file  . Years of education: Not on file  . Highest education level: Not on file  Occupational History  . Not on file  Social Needs  . Financial resource strain: Not on file  . Food insecurity:    Worry: Not on file    Inability: Not on file  . Transportation needs:    Medical: Not on file    Non-medical: Not on file  Tobacco Use  . Smoking status: Never Smoker  . Smokeless tobacco: Never Used  Substance and Sexual Activity  . Alcohol use: Yes    Alcohol/week: 0.0 oz    Comment: none  . Drug use: No  . Sexual activity: Never  Lifestyle  .  Physical activity:    Days per week: Not on file    Minutes per session: Not on file  . Stress: Not on file  Relationships  . Social connections:    Talks on phone: Not on file    Gets together: Not on file    Attends religious service: Not on file    Active member of club or organization: Not on file    Attends meetings of clubs or organizations: Not on file    Relationship status: Not on file  Other Topics Concern  . Not on file  Social History Narrative   Fostering to adopt daughter June 2017    Family History  Problem Relation Age of Onset  . Cancer Mother 3       breast  . Stroke Mother   . Heart disease Father   . Diabetes Brother   . Heart disease Brother 26       massive MI  . Mental illness Brother   . Diabetes Maternal Grandmother   . Stroke Maternal Grandmother   . Diabetes Maternal Grandfather      ROS Review of Systems See HPI Constitution: No  fevers or chills No malaise No diaphoresis Skin: No rash or itching Eyes: no blurry vision, no double vision GU: no dysuria or hematuria Neuro: no dizziness or headaches * all others reviewed and negative   Objective: There were no vitals filed for this visit.  Physical Exam  Assessment and Plan There are no diagnoses linked to this encounter.   Alucard Fearnow P [employer]

## 2017-10-03 ENCOUNTER — Encounter: Payer: Federal, State, Local not specified - PPO | Admitting: Physician Assistant

## 2017-10-06 ENCOUNTER — Other Ambulatory Visit: Payer: Self-pay

## 2017-10-06 ENCOUNTER — Ambulatory Visit (INDEPENDENT_AMBULATORY_CARE_PROVIDER_SITE_OTHER): Payer: Federal, State, Local not specified - PPO | Admitting: Physician Assistant

## 2017-10-06 ENCOUNTER — Encounter: Payer: Self-pay | Admitting: Physician Assistant

## 2017-10-06 VITALS — BP 134/88 | HR 94 | Temp 98.4°F | Resp 18 | Ht 66.06 in | Wt 189.4 lb

## 2017-10-06 DIAGNOSIS — Z13228 Encounter for screening for other metabolic disorders: Secondary | ICD-10-CM

## 2017-10-06 DIAGNOSIS — Z1321 Encounter for screening for nutritional disorder: Secondary | ICD-10-CM | POA: Diagnosis not present

## 2017-10-06 DIAGNOSIS — E7800 Pure hypercholesterolemia, unspecified: Secondary | ICD-10-CM

## 2017-10-06 DIAGNOSIS — Z803 Family history of malignant neoplasm of breast: Secondary | ICD-10-CM

## 2017-10-06 DIAGNOSIS — Z1211 Encounter for screening for malignant neoplasm of colon: Secondary | ICD-10-CM

## 2017-10-06 DIAGNOSIS — Z01419 Encounter for gynecological examination (general) (routine) without abnormal findings: Secondary | ICD-10-CM

## 2017-10-06 DIAGNOSIS — N912 Amenorrhea, unspecified: Secondary | ICD-10-CM

## 2017-10-06 DIAGNOSIS — Z130 Encounter for screening for diseases of the blood and blood-forming organs and certain disorders involving the immune mechanism: Secondary | ICD-10-CM | POA: Diagnosis not present

## 2017-10-06 DIAGNOSIS — Z131 Encounter for screening for diabetes mellitus: Secondary | ICD-10-CM | POA: Diagnosis not present

## 2017-10-06 DIAGNOSIS — E039 Hypothyroidism, unspecified: Secondary | ICD-10-CM | POA: Diagnosis not present

## 2017-10-06 DIAGNOSIS — Z7183 Encounter for nonprocreative genetic counseling: Secondary | ICD-10-CM | POA: Diagnosis not present

## 2017-10-06 DIAGNOSIS — Z0000 Encounter for general adult medical examination without abnormal findings: Secondary | ICD-10-CM

## 2017-10-06 DIAGNOSIS — Z1371 Encounter for nonprocreative screening for genetic disease carrier status: Secondary | ICD-10-CM

## 2017-10-06 NOTE — Progress Notes (Signed)
[name]  MRN: [id number] DOB: [birth year]  PCP: Mancel Bale, PA-C   Chief Complaint  Patient presents with  . Annual Exam    Subjective:  Pt presents to clinic for a CPE.    Last dental exam: last year Last vision exam: wears glasses Last CZY:SAYT year - ASCUS with neg HPV testing Last mammo: 1/18 Last colonoscopy: never Vaccinations - UTD  Mom (diagnosis age early 56s) and multiple maternal aunts and paternal aunt all with breast cancer - these family members are older and she does not think that they had genetic testing at that time.  She would like to have BRCA testing - she is thinking at this time she would have a double masectomy for prevention if these tests were positive.  She has decreased her preventative care since becoming a mom and she does not want to have something that will decrease her life scan if she can have control due to her daughter.  Still in court for adoption of her daughter who is now 34 months old - she is still fostering her daughter but expects no problems with the adoption process  Typical meals for patient: irregular meals Typical beverage choices: juice, rare soda, not enough water Exercises:chase after child - otherwise no exercise Sleeps: irregular sleep patterns  - has a 55 month old - stress at work is causing problems with sleep  Patient Active Problem List   Diagnosis Date Noted  . Hypothyroidism 03/11/2010  . ANEMIA-NOS 03/11/2010  . DEPRESSION 03/11/2010  . Migraine headache 03/11/2010  . ASTHMA 03/11/2010  . GERD 03/11/2010    Review of Systems  Constitutional: Negative.   HENT: Negative.   Eyes: Negative.   Respiratory: Negative.   Cardiovascular: Negative.   Gastrointestinal: Negative.   Endocrine: Negative.   Genitourinary: Positive for menstrual problem (no since Feb - she does have some cramping that is cyclical but she has had no bleeding).       Hot flashes about 2 months ago really strong but have since  resolved  Musculoskeletal: Positive for myalgias. Joint swelling: left chest wall -not pain but awareness - worse when she moves her arms - her daughter crawls on her and pulls there as she holds her in her right arm/hip.  Skin: Negative.   Allergic/Immunologic: Negative.   Neurological: Negative.   Hematological: Negative.   Psychiatric/Behavioral: Negative.      Current Outpatient Medications on File Prior to Visit  Medication Sig Dispense Refill  . Cholecalciferol (VITAMIN D PO) Take by mouth.    . levothyroxine (SYNTHROID) 50 MCG tablet Take 1 tablet (50 mcg total) by mouth daily before breakfast. 90 tablet 1  . SUMAtriptan (IMITREX) 50 MG tablet Take 1 tablet (50 mg total) by mouth every 2 (two) hours as needed for migraine. May repeat in 2 hours if headache persists or recurs. 30 tablet 3  . ALPRAZolam (XANAX) 0.25 MG tablet Take 1 tablet (0.25 mg total) by mouth 2 (two) times daily as needed for anxiety (panic attacks). (Patient not taking: Reported on 10/06/2017) 20 tablet 0  . b complex vitamins capsule Take 1 capsule by mouth daily.    . magic mouthwash w/lidocaine SOLN Take 13m swish and swallow no more than every 4 hours as needed (Patient not taking: Reported on 10/06/2017) 100 mL 0   No current facility-administered medications on file prior to visit.     Allergies  Allergen Reactions  . Codeine     REACTION: nause  and vomiting and pt states feels out of it.    Social History   Socioeconomic History  . Marital status: Single    Spouse name: Not on file  . Number of children: 1  . Years of education: Not on file  . Highest education level: Not on file  Occupational History  . Not on file  Social Needs  . Financial resource strain: Not on file  . Food insecurity:    Worry: Not on file    Inability: Not on file  . Transportation needs:    Medical: Not on file    Non-medical: Not on file  Tobacco Use  . Smoking status: Never Smoker  . Smokeless tobacco: Never  Used  Substance and Sexual Activity  . Alcohol use: Yes    Alcohol/week: 0.0 oz    Comment: rare social  . Drug use: No  . Sexual activity: Not Currently  Lifestyle  . Physical activity:    Days per week: Not on file    Minutes per session: Not on file  . Stress: Not on file  Relationships  . Social connections:    Talks on phone: Not on file    Gets together: Not on file    Attends religious service: Not on file    Active member of club or organization: Not on file    Attends meetings of clubs or organizations: Not on file    Relationship status: Not on file  Other Topics Concern  . Not on file  Social History Narrative   Fostering to adopt daughter June 2017    Past Surgical History:  Procedure Laterality Date  . MYOMECTOMY    . WISDOM TOOTH EXTRACTION      Family History  Problem Relation Age of Onset  . Cancer Mother 4       breast  . Stroke Mother   . Heart disease Father   . Diabetes Brother   . Heart disease Brother 4       massive MI  . Mental illness Brother   . Diabetes Maternal Grandmother   . Stroke Maternal Grandmother   . Diabetes Maternal Grandfather      Objective:  BP 134/88 (BP Location: Left Arm, Patient Position: Sitting, Cuff Size: Normal)   Pulse 94   Temp 98.4 F (36.9 C) (Oral)   Resp 18   Ht 5' 6.06" (1.678 m)   Wt 189 lb 6.4 oz (85.9 kg)   LMP 10/06/2017   SpO2 98%   BMI 30.51 kg/m   Physical Exam  Constitutional: She is oriented to person, place, and time. She appears well-developed and well-nourished.  HENT:  Head: Normocephalic and atraumatic.  Right Ear: Hearing, tympanic membrane, external ear and ear canal normal.  Left Ear: Hearing, tympanic membrane, external ear and ear canal normal.  Nose: Nose normal.  Mouth/Throat: Uvula is midline, oropharynx is clear and moist and mucous membranes are normal.  Eyes: Pupils are equal, round, and reactive to light. Conjunctivae and EOM are normal.  Neck: Trachea normal and  normal range of motion. Neck supple. No thyroid mass and no thyromegaly present.  Cardiovascular: Normal rate, regular rhythm and normal heart sounds.  No murmur heard. Pulmonary/Chest: Effort normal and breath sounds normal. She has no wheezes. She exhibits no tenderness. Right breast exhibits no inverted nipple, no mass, no nipple discharge, no skin change and no tenderness. Left breast exhibits no inverted nipple, no mass, no nipple discharge, no skin change and no tenderness.  Abdominal: Soft. Bowel sounds are normal. There is no tenderness.  Musculoskeletal: Normal range of motion.  Lymphadenopathy:    She has no cervical adenopathy.  Neurological: She is alert and oriented to person, place, and time. She has normal strength and normal reflexes.  Skin: Skin is warm and dry.  Psychiatric: Judgment normal.    Wt Readings from Last 3 Encounters:  10/06/17 189 lb 6.4 oz (85.9 kg)  06/01/17 192 lb (87.1 kg)  04/13/17 194 lb (88 kg)     Visual Acuity Screening   Right eye Left eye Both eyes  Without correction:     With correction: '20/25 20/30 20/15 '    Assessment and Plan :  Annual physical exam  Encounter for nonprocreative genetic counseling and testing - pt interested in BRCA testing.  She has contacted her insurance and this is the 1st step for them to cover the testing.  We discussed today what the results may mean for her - she will f/u with her mammogram that she knows she missed last year.   Family history of breast cancer in first degree relative  Colon cancer screening - Plan: Cologuard - she did not go to her colonoscopy but she is willing to do the cologuard  Encounter for vitamin deficiency screening - Plan: Vitamin B12, VITAMIN D 25 Hydroxy (Vit-D Deficiency, Fractures) - she was low normal Vit D and she would lke to have her Vit b checked - we will do that today  Elevated cholesterol - Plan: Lipid panel - elevated last visit - we will recheck labs to see the change  in the last year as the patient has continued to not eat well mainly due to timing and new habits  Screening for diabetes mellitus - Plan: Hemoglobin A1c - check labs  Screening for metabolic disorder - Plan: CMP14+EGFR - check labs  Screening for deficiency anemia - Plan: CBC with Differential/Platelet - check labs  Amenorrhea - no menses since Feb - watch and wait for possible menopause start -  Windell Hummingbird PA-C  [facility] 10/06/2017 5:10 PM

## 2017-10-06 NOTE — Patient Instructions (Addendum)
Find out from insurance company which ICD 10 codes need to be on her visit for BrCa coverage    IF you received an x-ray today, you will receive an invoice from [facility]. Please contact [facility] at [phone] with questions or concerns regarding your invoice.   IF you received labwork today, you will receive an invoice from [facility]. Please contact [facility] at [phone] with questions or concerns regarding your invoice.   Our billing staff will not be able to assist you with questions regarding bills from these companies.  You will be contacted with the lab results as soon as they are available. The fastest way to get your results is to activate your My Chart account. Instructions are located on the last page of this paperwork. If you have not heard from [place] regarding the results in 2 weeks, please contact this office.

## 2017-10-07 LAB — LIPID PANEL
CHOL/HDL RATIO: 2.7 ratio (ref 0.0–4.4)
Cholesterol, Total: 180 mg/dL (ref 100–199)
HDL: 66 mg/dL (ref >39)
LDL Calculated: 103 mg/dL — ABNORMAL HIGH (ref 0–99)
TRIGLYCERIDES: 53 mg/dL (ref 0–149)
VLDL Cholesterol Cal: 11 mg/dL (ref 5–40)

## 2017-10-07 LAB — CBC WITH DIFFERENTIAL/PLATELET
BASOS ABS: 0 10*3/uL (ref 0.0–0.2)
Basos: 0 %
EOS (ABSOLUTE): 0.1 10*3/uL (ref 0.0–0.4)
Eos: 1 %
Hematocrit: 39.7 % (ref 34.0–46.6)
Hemoglobin: 13.4 g/dL (ref 11.1–15.9)
IMMATURE GRANS (ABS): 0 10*3/uL (ref 0.0–0.1)
Immature Granulocytes: 0 %
LYMPHS ABS: 2.3 10*3/uL (ref 0.7–3.1)
LYMPHS: 40 %
MCH: 28.8 pg (ref 26.6–33.0)
MCHC: 33.8 g/dL (ref 31.5–35.7)
MCV: 85 fL (ref 79–97)
Monocytes Absolute: 0.4 10*3/uL (ref 0.1–0.9)
Monocytes: 7 %
NEUTROS ABS: 3 10*3/uL (ref 1.4–7.0)
Neutrophils: 52 %
PLATELETS: 372 10*3/uL (ref 150–379)
RBC: 4.65 x10E6/uL (ref 3.77–5.28)
RDW: 14.7 % (ref 12.3–15.4)
WBC: 5.8 10*3/uL (ref 3.4–10.8)

## 2017-10-07 LAB — CMP14+EGFR
ALT: 10 IU/L (ref 0–32)
AST: 15 IU/L (ref 0–40)
Albumin/Globulin Ratio: 1.1 — ABNORMAL LOW (ref 1.2–2.2)
Albumin: 4 g/dL (ref 3.5–5.5)
Alkaline Phosphatase: 59 IU/L (ref 39–117)
BILIRUBIN TOTAL: 0.4 mg/dL (ref 0.0–1.2)
BUN/Creatinine Ratio: 13 (ref 9–23)
BUN: 10 mg/dL (ref 6–24)
CHLORIDE: 101 mmol/L (ref 96–106)
CO2: 24 mmol/L (ref 20–29)
Calcium: 9.2 mg/dL (ref 8.7–10.2)
Creatinine, Ser: 0.79 mg/dL (ref 0.57–1.00)
GFR calc Af Amer: 100 mL/min/{1.73_m2} (ref >59)
GFR calc non Af Amer: 87 mL/min/{1.73_m2} (ref >59)
GLUCOSE: 85 mg/dL (ref 65–99)
Globulin, Total: 3.6 g/dL (ref 1.5–4.5)
Potassium: 3.7 mmol/L (ref 3.5–5.2)
Sodium: 140 mmol/L (ref 134–144)
Total Protein: 7.6 g/dL (ref 6.0–8.5)

## 2017-10-07 LAB — HEMOGLOBIN A1C
Est. average glucose Bld gHb Est-mCnc: 100 mg/dL
HEMOGLOBIN A1C: 5.1 % (ref 4.8–5.6)

## 2017-10-07 LAB — VITAMIN D 25 HYDROXY (VIT D DEFICIENCY, FRACTURES): VIT D 25 HYDROXY: 24.9 ng/mL — AB (ref 30.0–100.0)

## 2017-10-07 LAB — VITAMIN B12: VITAMIN B 12: 613 pg/mL (ref 232–1245)

## 2017-10-07 LAB — TSH: TSH: 0.736 u[IU]/mL (ref 0.450–4.500)

## 2017-10-10 LAB — PAP IG AND HPV HIGH-RISK
HPV, high-risk: NEGATIVE
PAP Smear Comment: 0

## 2017-10-14 ENCOUNTER — Encounter: Payer: Self-pay | Admitting: Physician Assistant

## 2017-10-20 ENCOUNTER — Encounter: Payer: Self-pay | Admitting: Physician Assistant

## 2017-11-07 ENCOUNTER — Other Ambulatory Visit: Payer: Self-pay | Admitting: Physician Assistant

## 2017-11-07 ENCOUNTER — Other Ambulatory Visit: Payer: Self-pay | Admitting: Internal Medicine

## 2017-11-07 DIAGNOSIS — Z1231 Encounter for screening mammogram for malignant neoplasm of breast: Secondary | ICD-10-CM

## 2017-11-23 ENCOUNTER — Other Ambulatory Visit: Payer: Self-pay

## 2017-11-23 ENCOUNTER — Encounter: Payer: Self-pay | Admitting: Physician Assistant

## 2017-11-23 ENCOUNTER — Ambulatory Visit: Payer: Federal, State, Local not specified - PPO | Admitting: Physician Assistant

## 2017-11-23 VITALS — BP 118/80 | HR 92 | Temp 98.5°F | Resp 16 | Ht 66.0 in | Wt 190.2 lb

## 2017-11-23 DIAGNOSIS — J302 Other seasonal allergic rhinitis: Secondary | ICD-10-CM

## 2017-11-23 DIAGNOSIS — H6523 Chronic serous otitis media, bilateral: Secondary | ICD-10-CM | POA: Diagnosis not present

## 2017-11-23 DIAGNOSIS — H6983 Other specified disorders of Eustachian tube, bilateral: Secondary | ICD-10-CM | POA: Diagnosis not present

## 2017-11-23 MED ORDER — PREDNISONE 10 MG PO TABS: ORAL_TABLET | ORAL | 0 refills | Status: AC

## 2017-11-23 MED ORDER — CETIRIZINE-PSEUDOEPHEDRINE ER 5-120 MG PO TB12: 1.0000 | ORAL_TABLET | Freq: Two times a day (BID) | ORAL | 4 refills | Status: DC

## 2017-11-23 MED ORDER — TRIAMCINOLONE ACETONIDE 55 MCG/ACT NA AERO: 2.0000 | INHALATION_SPRAY | Freq: Every day | NASAL | 0 refills | Status: DC

## 2017-11-23 NOTE — Progress Notes (Signed)
[name]  MRN: [id number] DOB: Oct 11, 1965  PCP: Mancel Bale, PA-C  Chief Complaint  Patient presents with  . Cerumen Impaction    both ears clogged x 2 weeks     Subjective:  Pt presents to clinic for bilateral ear pressure and decreased hearing.  She has been having worsing allergy symptoms over the last month with sinus congestion and PND with resulting sore throat.  She has had a friend who sustained long term hearing loss from not being evaluated for ear pressure and she did not want to wait.  She takes zyrtec D daily for her allergies.  History is obtained by patient.  Review of Systems  HENT: Positive for congestion, ear pain (pressure), hearing loss (muffled) and sore throat. Negative for ear discharge.   Allergic/Immunologic: Positive for environmental allergies.    Patient Active Problem List   Diagnosis Date Noted  . Hypothyroidism 03/11/2010  . ANEMIA-NOS 03/11/2010  . DEPRESSION 03/11/2010  . Migraine headache 03/11/2010  . ASTHMA 03/11/2010  . GERD 03/11/2010    Current Outpatient Medications on File Prior to Visit  Medication Sig Dispense Refill  . b complex vitamins capsule Take 1 capsule by mouth daily.    . Cholecalciferol (VITAMIN D PO) Take by mouth.    . levothyroxine (SYNTHROID) 50 MCG tablet Take 1 tablet (50 mcg total) by mouth daily before breakfast. 90 tablet 1  . SUMAtriptan (IMITREX) 50 MG tablet Take 1 tablet (50 mg total) by mouth every 2 (two) hours as needed for migraine. May repeat in 2 hours if headache persists or recurs. 30 tablet 3  . ALPRAZolam (XANAX) 0.25 MG tablet Take 1 tablet (0.25 mg total) by mouth 2 (two) times daily as needed for anxiety (panic attacks). (Patient not taking: Reported on 10/06/2017) 20 tablet 0   No current facility-administered medications on file prior to visit.     Allergies  Allergen Reactions  . Codeine     REACTION: nause and vomiting and pt states feels out of it.    Past Medical History:    Diagnosis Date  . Allergy   . Anemia   . Depression   . Fibroids   . Frequent headaches   . Thyroid disease    Social History   Social History Narrative   Fostering to adopt daughter June 2017   Social History   Tobacco Use  . Smoking status: Never Smoker  . Smokeless tobacco: Never Used  Substance Use Topics  . Alcohol use: Yes    Alcohol/week: 0.0 oz    Comment: rare social  . Drug use: No   family history includes Cancer (age of onset: 67) in her mother; Diabetes in her brother, maternal grandfather, and maternal grandmother; Heart disease in her father; Heart disease (age of onset: 67) in her brother; Mental illness in her brother; Stroke in her maternal grandmother and mother.     Objective:  BP 118/80   Pulse 92   Temp 98.5 F (36.9 C)   Resp 16   Ht 5\' 6"  (1.676 m)   Wt 190 lb 3.2 oz (86.3 kg)   LMP 10/06/2017   SpO2 99%   BMI 30.70 kg/m  Body mass index is 30.7 kg/m.  Wt Readings from Last 3 Encounters:  11/23/17 190 lb 3.2 oz (86.3 kg)  10/06/17 189 lb 6.4 oz (85.9 kg)  06/01/17 192 lb (87.1 kg)    Physical Exam  Constitutional: She is oriented to person, place, and time.  She appears well-developed and well-nourished.  HENT:  Head: Normocephalic and atraumatic.  Right Ear: Hearing, external ear and ear canal normal. Tympanic membrane is bulging (serous fluid ). Tympanic membrane is not injected, not perforated and not erythematous. A middle ear effusion is present.  Left Ear: Hearing, external ear and ear canal normal. Tympanic membrane is retracted. Tympanic membrane is not injected, not perforated, not erythematous and not bulging.  No middle ear effusion.  Nose: Mucosal edema (turbinates touching septum) present.  Mouth/Throat: Uvula is midline, oropharynx is clear and moist and mucous membranes are normal.  Congested sounding  Eyes: Conjunctivae are normal.  Neck: Normal range of motion.  Cardiovascular: Normal rate, regular rhythm and normal  heart sounds.  No murmur heard. Pulmonary/Chest: Effort normal and breath sounds normal. She has no wheezes.  Lymphadenopathy:       Head (right side): No tonsillar, no preauricular, no posterior auricular and no occipital adenopathy present.       Head (left side): No tonsillar, no preauricular, no posterior auricular and no occipital adenopathy present.    She has no cervical adenopathy.       Right: No supraclavicular adenopathy present.       Left: No supraclavicular adenopathy present.  Neurological: She is alert and oriented to person, place, and time.  Skin: Skin is warm and dry.  Psychiatric: She has a normal mood and affect. Her behavior is normal. Judgment and thought content normal.  Vitals reviewed.   Assessment and Plan :  Dysfunction of both eustachian tubes  Bilateral chronic serous otitis media - Plan: predniSONE (DELTASONE) 10 MG tablet, triamcinolone (NASACORT) 55 MCG/ACT AERO nasal inhaler  Seasonal allergies - Plan: cetirizine-pseudoephedrine (ZYRTEC-D ALLERGY & CONGESTION) 5-120 MG tablet  Symptomatic care d/w pt - we will recheck in 2 weeks to make sure improvement - I suspect her allergies are causing this and as we get her sinus congestion to improve we will see improvement in ears.   Windell Hummingbird PA-C  [facility] 11/24/2017 8:10 AM

## 2017-11-23 NOTE — Patient Instructions (Addendum)
Continue zyrtec D  Start prednisone - after that ok to try netti pot  Use nasal spray daily until I see you in a month    IF you received an x-ray today, you will receive an invoice from [facility]. Please contact [facility] at [phone] with questions or concerns regarding your invoice.   IF you received labwork today, you will receive an invoice from [facility]. Please contact [facility] at [phone] with questions or concerns regarding your invoice.   Our billing staff will not be able to assist you with questions regarding bills from these companies.  You will be contacted with the lab results as soon as they are available. The fastest way to get your results is to activate your My Chart account. Instructions are located on the last page of this paperwork. If you have not heard from [place] regarding the results in 2 weeks, please contact this office.

## 2017-11-24 ENCOUNTER — Encounter: Payer: Self-pay | Admitting: Physician Assistant

## 2017-11-27 ENCOUNTER — Telehealth: Payer: Self-pay | Admitting: Physician Assistant

## 2017-11-27 NOTE — Telephone Encounter (Signed)
Please advise 

## 2017-11-27 NOTE — Telephone Encounter (Signed)
Copied from [place] [id number]. Topic: Inquiry >> Nov 24, 2017  8:43 AM Scherrie Gerlach wrote: Reason for CRM: pt seen yesterday and forgot to ask for a note for work.  Pt would like the note to state that pt should be restricted to using the phone only, because pt cannot hear if they place her up in the front desk or side window area. Pt can hear if she is on the phone, she can turn the volume up.  Right now pt unable to hear other than being on the phone. Pt would like you to fax asap, then pt can pick up original with the provider's signature. Fax:  [phone] Please call when this is faxed so pt can pick up at that time.  In addition, pt wants to know if the dr wants her to take the sudafed D along with the zyrtec, or is that too much?  >> Nov 24, 2017  4:52 PM Neva Seat wrote: Pt called back checking on the status of the letter and needing to speak with nurse about the medication.  Please call pt back today! >> Nov 27, 2017 10:47 AM Bea Graff, NT wrote: Pt calling to check status on the letter for her job. She would like to see if she can be restricted to the phones only because she is having a hard time hearing at the front desk with the pts. She would like to see if this submitted in her Mychart.

## 2017-11-28 NOTE — Telephone Encounter (Signed)
Pt calling to check on status of letter requested. She will call later to check again if she has not heard back. Ok to leave detailed message if she does not answer.

## 2017-11-29 ENCOUNTER — Telehealth: Payer: Self-pay | Admitting: Physician Assistant

## 2017-11-29 ENCOUNTER — Encounter: Payer: Self-pay | Admitting: *Deleted

## 2017-11-29 ENCOUNTER — Encounter: Payer: Self-pay | Admitting: Physician Assistant

## 2017-11-29 NOTE — Telephone Encounter (Signed)
This encounter was created in error - please disregard.

## 2017-11-29 NOTE — Telephone Encounter (Signed)
Pt called and asked about the letter she is needing due to her hearing loss; she would like to be called back to speak about it but no one has called her yet; since Weber is out of the office until Friday and pt asked for this last week pt is wondering if another physician could write a note for her, call pt to advise

## 2017-11-29 NOTE — Telephone Encounter (Signed)
Returning call to pt regarding needing a letter for work. Pt states she was seen by Judson Roch on 6/6 regarding difficulty hearing.  Pt states she forgot to ask Sarah for the letter during the office visit. Pt asking for a letter for work so that she can do phone interviews at work because  she is really having a difficult time hearing when people are standing in from of her. Pt states when she is on the phone she can hear better and is able to turn the volume up on the phone. Pt asking if another provider would be willing to write the letter if Judson Roch is not going to be available in the office.

## 2017-11-29 NOTE — Telephone Encounter (Unsigned)
Copied from [place] [id number]. Topic: Inquiry >> Nov 24, 2017  8:43 AM [name] wrote: Reason for CRM: pt seen yesterday and forgot to ask for a note for work.  Pt would like the note to state that pt should be restricted to using the phone only, because pt cannot hear if they place her up in the front desk or side window area. Pt can hear if she is on the phone, she can turn the volume up.  Right now pt unable to hear other than being on the phone. Pt would like you to fax asap, then pt can pick up original with the provider's signature. Fax:  [phone] Please call when this is faxed so pt can pick up at that time.  In addition, pt wants to know if the dr wants her to take the sudafed D along with the zyrtec, or is that too much?  >> Nov 24, 2017  4:52 PM [name] wrote: Pt called back checking on the status of the letter and needing to speak with nurse about the medication.  Please call pt back today! >> Nov 27, 2017 10:47 AM [name], NT wrote: Pt calling to check status on the letter for her job. She would like to see if she can be restricted to the phones only because she is having a hard time hearing at the front desk with the pts. She would like to see if this submitted in her Mychart.

## 2017-11-30 NOTE — Telephone Encounter (Signed)
Letter drafted for provider approval.   Patient is requesting a signed copy to pick up at the [address]. Would like a phone call with the status of her letter before/at 9am tomorrow.

## 2017-11-30 NOTE — Telephone Encounter (Signed)
Pt has called again asking for status of note for work. Per PEC, pt is upset.    Sent to Sarah via high priority.

## 2017-12-01 ENCOUNTER — Encounter: Payer: Self-pay | Admitting: Physician Assistant

## 2017-12-01 NOTE — Telephone Encounter (Signed)
Please fax letter to the number below - then I will have a signed one at [employer].

## 2017-12-01 NOTE — Telephone Encounter (Signed)
Phone call to patient. She would like to pick up copy that has been signed at [employer] [address] tomorrow. Patient advised of office hours. She is appreciative of care.   Signed copy of note placed up at [address] front desk for patient pickup tomorrow.

## 2017-12-04 ENCOUNTER — Ambulatory Visit
Admission: RE | Admit: 2017-12-04 | Discharge: 2017-12-04 | Disposition: A | Discharge status: Home / Self Care | Payer: Federal, State, Local not specified - PPO | Source: Ambulatory Visit | Attending: Physician Assistant | Admitting: Physician Assistant

## 2017-12-04 DIAGNOSIS — Z1231 Encounter for screening mammogram for malignant neoplasm of breast: Secondary | ICD-10-CM | POA: Diagnosis not present

## 2017-12-05 ENCOUNTER — Other Ambulatory Visit: Payer: Self-pay | Admitting: Family Medicine

## 2017-12-06 NOTE — Telephone Encounter (Signed)
Please advise. Dgaddy, CMA 

## 2017-12-07 NOTE — Telephone Encounter (Signed)
Patient is out of her levothyroxine (SYNTHROID) 50 MCG tablet medication.  She would like to know the status of the refill request because. She also stated that she needs a prescription for the BRAND NAME (SYNTHROID).

## 2017-12-07 NOTE — Telephone Encounter (Signed)
Patient is requesting the BRAND NAME Synthroid.  Last OV:10/06/17 Last refill:04/06/17 90 tab/1 ordered [facility] Pharmacy: [facility] [address] [place] [phone] (Phone) [phone] (Fax)

## 2017-12-07 NOTE — Telephone Encounter (Signed)
Done

## 2017-12-18 ENCOUNTER — Encounter: Payer: Self-pay | Admitting: Physician Assistant

## 2018-01-09 ENCOUNTER — Ambulatory Visit: Payer: Federal, State, Local not specified - PPO | Admitting: Physician Assistant

## 2018-01-11 ENCOUNTER — Ambulatory Visit: Payer: Federal, State, Local not specified - PPO | Admitting: Physician Assistant

## 2018-01-24 ENCOUNTER — Ambulatory Visit: Payer: Federal, State, Local not specified - PPO | Admitting: Physician Assistant

## 2018-01-29 ENCOUNTER — Ambulatory Visit: Payer: Federal, State, Local not specified - PPO | Admitting: Physician Assistant

## 2018-02-02 ENCOUNTER — Other Ambulatory Visit: Payer: Self-pay

## 2018-02-02 ENCOUNTER — Encounter: Payer: Self-pay | Admitting: Physician Assistant

## 2018-02-02 ENCOUNTER — Ambulatory Visit: Payer: Federal, State, Local not specified - PPO | Admitting: Physician Assistant

## 2018-02-02 VITALS — BP 126/82 | HR 99 | Temp 98.2°F | Resp 18 | Ht 66.0 in | Wt 195.4 lb

## 2018-02-02 DIAGNOSIS — H6983 Other specified disorders of Eustachian tube, bilateral: Secondary | ICD-10-CM

## 2018-02-02 DIAGNOSIS — G43809 Other migraine, not intractable, without status migrainosus: Secondary | ICD-10-CM

## 2018-02-02 DIAGNOSIS — T753XXA Motion sickness, initial encounter: Secondary | ICD-10-CM

## 2018-02-02 DIAGNOSIS — E039 Hypothyroidism, unspecified: Secondary | ICD-10-CM

## 2018-02-02 DIAGNOSIS — Z803 Family history of malignant neoplasm of breast: Secondary | ICD-10-CM

## 2018-02-02 DIAGNOSIS — J302 Other seasonal allergic rhinitis: Secondary | ICD-10-CM | POA: Diagnosis not present

## 2018-02-02 DIAGNOSIS — Z1211 Encounter for screening for malignant neoplasm of colon: Secondary | ICD-10-CM

## 2018-02-02 DIAGNOSIS — H6523 Chronic serous otitis media, bilateral: Secondary | ICD-10-CM

## 2018-02-02 MED ORDER — SCOPOLAMINE 1 MG/3DAYS TD PT72: 1.0000 | MEDICATED_PATCH | TRANSDERMAL | 0 refills | Status: DC

## 2018-02-02 MED ORDER — CETIRIZINE-PSEUDOEPHEDRINE ER 5-120 MG PO TB12: 1.0000 | ORAL_TABLET | Freq: Two times a day (BID) | ORAL | 4 refills | Status: DC

## 2018-02-02 MED ORDER — NAPROXEN 500 MG PO TABS: 500.0000 mg | ORAL_TABLET | Freq: Two times a day (BID) | ORAL | 0 refills | Status: DC

## 2018-02-02 MED ORDER — AZELASTINE HCL 0.1 % NA SOLN: 2.0000 | Freq: Two times a day (BID) | NASAL | 12 refills | Status: AC

## 2018-02-02 MED ORDER — SUMATRIPTAN SUCCINATE 50 MG PO TABS
50.0000 mg | ORAL_TABLET | ORAL | 3 refills | Status: DC | PRN
Start: 2018-02-02 — End: 2018-06-08

## 2018-02-02 MED ORDER — TRIAMCINOLONE ACETONIDE 55 MCG/ACT NA AERO: 2.0000 | INHALATION_SPRAY | Freq: Every day | NASAL | 0 refills | Status: DC

## 2018-02-02 NOTE — Progress Notes (Signed)
[name]  MRN: [id number] DOB: [birth year]  PCP: Mancel Bale, PA-C  Chief Complaint  Patient presents with  . Ear Pain    follow up and brca test     Subjective:  Pt presents to clinic for bilateral ear pressure.  She is currently using nasacort and zyrtec D for her ETD.  The right ear is clear and she can hear without problems.  The left ear is better but there has been no movement but it is better.  Her allergies are relatively controlled but the fall they are always worse.  She is not interested in seeing a specialist at this time.  She needs refills on her medications.  She is also leaving for a cruise in the next several weeks and would like to have Transderm-Scop to help with nausea.  Interested in Brca testing due to mothers recent breast cancer diagnosis.  History is obtained by patient.  Review of Systems  HENT: Positive for congestion, hearing loss (Muffled on the left, has returned to normal on the right), postnasal drip and rhinorrhea. Negative for ear pain.   Allergic/Immunologic: Positive for environmental allergies.    Patient Active Problem List   Diagnosis Date Noted  . Hypothyroidism 03/11/2010  . ANEMIA-NOS 03/11/2010  . DEPRESSION 03/11/2010  . Migraine headache 03/11/2010  . ASTHMA 03/11/2010  . GERD 03/11/2010    Current Outpatient Medications on File Prior to Visit  Medication Sig Dispense Refill  . ALPRAZolam (XANAX) 0.25 MG tablet Take 1 tablet (0.25 mg total) by mouth 2 (two) times daily as needed for anxiety (panic attacks). 20 tablet 0  . b complex vitamins capsule Take 1 capsule by mouth daily.    . Cholecalciferol (VITAMIN D PO) Take by mouth.    . SYNTHROID 50 MCG tablet TAKE 1 TABLET BY MOUTH ONCE DAILY BEFORE BREAKFAST 90 tablet 1   No current facility-administered medications on file prior to visit.     Allergies  Allergen Reactions  . Codeine     REACTION: nause and vomiting and pt states feels out of it.  Marland Kitchen Singulair  [Montelukast Sodium]     Back pain    Past Medical History:  Diagnosis Date  . Allergy   . Anemia   . Depression   . Fibroids   . Frequent headaches   . Thyroid disease    Social History   Social History Narrative   Fostering to adopt daughter June 2017   Social History   Tobacco Use  . Smoking status: Never Smoker  . Smokeless tobacco: Never Used  Substance Use Topics  . Alcohol use: Yes    Alcohol/week: 0.0 standard drinks    Comment: rare social  . Drug use: No   family history includes Breast cancer in her maternal aunt; Breast cancer (age of onset: 70) in her mother; Cancer (age of onset: 104) in her mother; Diabetes in her brother, maternal grandfather, and maternal grandmother; Heart disease in her father; Heart disease (age of onset: 26) in her brother; Mental illness in her brother; Stroke in her maternal grandmother and mother.     Objective:  BP 126/82   Pulse 99   Temp 98.2 F (36.8 C) (Oral)   Resp 18   Ht '5\' 6"'  (1.676 m)   Wt 195 lb 6.4 oz (88.6 kg)   LMP 10/06/2017   SpO2 98%   BMI 31.54 kg/m   Body mass index is 31.54 kg/m.  Wt Readings from Last 3  Encounters:  02/02/18 195 lb 6.4 oz (88.6 kg)  11/23/17 190 lb 3.2 oz (86.3 kg)  10/06/17 189 lb 6.4 oz (85.9 kg)    Physical Exam  Constitutional: She is oriented to person, place, and time. She appears well-developed and well-nourished.  HENT:  Head: Normocephalic and atraumatic.  Right Ear: Hearing, tympanic membrane, external ear and ear canal normal. Tympanic membrane is not injected, not perforated and not erythematous. No middle ear effusion.  Left Ear: Hearing, external ear and ear canal normal. A middle ear effusion (Air-fluid levels present on the inferior aspect posterior TM) is present.  Nose: Mucosal edema (Bilateral edema noted) present.  Mouth/Throat: Uvula is midline, oropharynx is clear and moist and mucous membranes are normal.  Eyes: Conjunctivae are normal.  Neck: Normal range  of motion.  Cardiovascular: Normal rate, regular rhythm and normal heart sounds.  No murmur heard. Pulmonary/Chest: Effort normal and breath sounds normal. She has no wheezes.  Lymphadenopathy:       Head (right side): No tonsillar, no preauricular, no posterior auricular and no occipital adenopathy present.       Head (left side): No tonsillar, no preauricular, no posterior auricular and no occipital adenopathy present.    She has no cervical adenopathy.       Right: No supraclavicular adenopathy present.       Left: No supraclavicular adenopathy present.  Neurological: She is alert and oriented to person, place, and time.  Skin: Skin is warm and dry.  Psychiatric: She has a normal mood and affect. Her behavior is normal. Judgment and thought content normal.  Vitals reviewed.   Assessment and Plan :  Dysfunction of both eustachian tubes - Plan: azelastine (ASTELIN) 0.1 % nasal spray -ears are much better will add Astelin as patient has been intolerant to Singulair in the past.  She may need a ENT referral but at this time does not wish to have that.  Screen for colon cancer - Plan: Ambulatory referral to Gastroenterology -she was unable to do Cologuard and would like to have a referral for colonoscopy.  Seasonal allergies - Plan: azelastine (ASTELIN) 0.1 % nasal spray, cetirizine-pseudoephedrine (ZYRTEC-D ALLERGY & CONGESTION) 5-120 MG tablet  Family history of breast cancer - Plan: Ambulatory referral to Genetics  Motion sickness, initial encounter - Plan: scopolamine (TRANSDERM-SCOP, 1.5 MG,) 1 MG/3DAYS -discussed with patient how to use  Acquired hypothyroidism -refilled medications last labs in April were normal.  Other migraine without status migrainosus, not intractable - Plan: SUMAtriptan (IMITREX) 50 MG tablet, naproxen (NAPROSYN) 500 MG tablet -refilled medications this works well for patient  Bilateral chronic serous otitis media - Plan: triamcinolone (NASACORT) 55 MCG/ACT  AERO nasal inhaler  Patient verbalized to me that they understand the following: diagnosis, what is being done for them, what to expect and what should be done at home.  Their questions have been answered.  See after visit summary for patient specific instructions.  Windell Hummingbird PA-C  [facility] 02/02/2018 9:20 AM  Please note: Portions of this report may have been transcribed using dragon voice recognition software. Every effort was made to ensure accuracy; however, inadvertent computerized transcription errors may be present.

## 2018-02-02 NOTE — Patient Instructions (Addendum)
   [facility] - [address], [place], [place] [address] Phone: [phone]   If you have lab work done today you will be contacted with your lab results within the next 2 weeks.  If you have not heard from us then please contact us. The fastest way to get your results is to register for My Chart.   IF you received an x-ray today, you will receive an invoice from [facility]. Please contact [facility] at [phone] with questions or concerns regarding your invoice.   IF you received labwork today, you will receive an invoice from [facility]. Please contact [facility] at [phone] with questions or concerns regarding your invoice.   Our billing staff will not be able to assist you with questions regarding bills from these companies.  You will be contacted with the lab results as soon as they are available. The fastest way to get your results is to activate your My Chart account. Instructions are located on the last page of this paperwork. If you have not heard from us regarding the results in 2 weeks, please contact this office.     

## 2018-02-05 ENCOUNTER — Telehealth: Payer: Self-pay | Admitting: Physician Assistant

## 2018-02-05 NOTE — Telephone Encounter (Signed)
Copied from [place] [phone]. Topic: General - Other >> Feb 02, 2018  4:07 PM Bea Graff, NT wrote: Reason for CRM: Pt would like to see if the return to work note can be put in her MyChart as well as faxed to [phone] before 5:30pm today. Please call pt once done.

## 2018-02-05 NOTE — Telephone Encounter (Signed)
Left message faxed note and it is mychart.

## 2018-02-05 NOTE — Telephone Encounter (Signed)
Pt called back today and said that she has not got a fax for work note yet. She stated that she needs it today, if it can be send and can someone call her back, please.

## 2018-02-05 NOTE — Telephone Encounter (Signed)
Refaxed note to [phone].  Received confirmation of successful transmission.

## 2018-03-07 ENCOUNTER — Telehealth: Payer: Self-pay | Admitting: Physician Assistant

## 2018-03-07 NOTE — Telephone Encounter (Signed)
Copied from [place] [phone]. Topic: Quick Communication - See Telephone Encounter >> Mar 07, 2018  9:48 AM Ivar Drape wrote: CRM for notification. See Telephone encounter for: 03/07/18. Patient would like a letter written saying she suffers from severe migranes and may at times have to be out of work. Patient also stated that Dr. Nolon Rod has already written a letter to her employer stating her condition but that letter didn't say she will at times have to be out of work. Patient would like to have this put in her file today if possible.  She is at work right now and is experiencing a migrane.

## 2018-03-07 NOTE — Telephone Encounter (Signed)
Please see note below and advise  

## 2018-03-08 ENCOUNTER — Encounter: Payer: Self-pay | Admitting: Family Medicine

## 2018-03-08 NOTE — Telephone Encounter (Signed)
Letter sent to my chart

## 2018-03-15 ENCOUNTER — Encounter: Payer: Self-pay | Admitting: Physician Assistant

## 2018-04-24 DIAGNOSIS — G44319 Acute post-traumatic headache, not intractable: Secondary | ICD-10-CM | POA: Diagnosis not present

## 2018-04-24 DIAGNOSIS — W0110XA Fall on same level from slipping, tripping and stumbling with subsequent striking against unspecified object, initial encounter: Secondary | ICD-10-CM | POA: Diagnosis not present

## 2018-04-24 DIAGNOSIS — R030 Elevated blood-pressure reading, without diagnosis of hypertension: Secondary | ICD-10-CM | POA: Diagnosis not present

## 2018-04-24 DIAGNOSIS — S098XXA Other specified injuries of head, initial encounter: Secondary | ICD-10-CM | POA: Diagnosis not present

## 2018-05-22 DIAGNOSIS — J029 Acute pharyngitis, unspecified: Secondary | ICD-10-CM | POA: Diagnosis not present

## 2018-06-08 ENCOUNTER — Other Ambulatory Visit: Payer: Self-pay | Admitting: Family Medicine

## 2018-06-08 DIAGNOSIS — G43809 Other migraine, not intractable, without status migrainosus: Secondary | ICD-10-CM

## 2018-06-08 NOTE — Telephone Encounter (Signed)
Requested medication (s) are due for refill today: yes  Requested medication (s) are on the active medication list: yes  Last refill:  Historical provider  Future visit scheduled: no  Notes to clinic:      Requested Prescriptions  Pending Prescriptions Disp Refills   SUMAtriptan (IMITREX) 50 MG tablet 30 tablet 3    Sig: Take 1 tablet (50 mg total) by mouth every 2 (two) hours as needed for migraine. May repeat in 2 hours if headache persists or recurs.     Neurology:  Migraine Therapy - Triptan Passed - 06/08/2018  1:49 PM      Passed - Last BP in normal range    BP Readings from Last 1 Encounters:  02/02/18 126/82         Passed - Valid encounter within last 12 months    Recent Outpatient Visits          4 months ago Dysfunction of both eustachian tubes   [facility] at [place], PA-C   6 months ago Dysfunction of both eustachian tubes   [facility] at [place], PA-C   8 months ago Annual physical exam   [facility], Damaris Hippo, PA-C   1 year ago Stress reaction   [facility] at [facility], Zoe A, MD   1 year ago Non-seasonal allergic rhinitis due to pollen   Primary Care at [facility], Arlie Solomons, MD            naproxen (NAPROSYN) 500 MG tablet 30 tablet 0    Sig: Take 1 tablet (500 mg total) by mouth 2 (two) times daily with a meal.     Analgesics:  NSAIDS Passed - 06/08/2018  1:49 PM      Passed - Cr in normal range and within 360 days    Creatinine, Ser  Date Value Ref Range Status  10/06/2017 0.79 0.57 - 1.00 mg/dL Final         Passed - HGB in normal range and within 360 days    Hemoglobin  Date Value Ref Range Status  10/06/2017 13.4 11.1 - 15.9 g/dL Final         Passed - Patient is not pregnant      Passed - Valid encounter within last 12 months    Recent Outpatient Visits          4 months ago Dysfunction of both eustachian tubes   [facility] [place], PA-C   6 months ago  Dysfunction of both eustachian tubes   [facility] [place], PA-C   8 months ago Annual physical exam   [facility], Damaris Hippo, PA-C   1 year ago Stress reaction   [facility] at [facility], Arlie Solomons, MD   1 year ago Non-seasonal allergic rhinitis due to pollen   Primary Care at [facility], Arlie Solomons, MD            SYNTHROID 50 MCG tablet 90 tablet 1     Endocrinology:  Hypothyroid Agents Failed - 06/08/2018  1:49 PM      Failed - TSH needs to be rechecked within 3 months after an abnormal result. Refill until TSH is due.      Passed - TSH in normal range and within 360 days    TSH  Date Value Ref Range Status  10/06/2017 0.736 0.450 - 4.500 uIU/mL Final  Passed - Valid encounter within last 12 months    Recent Outpatient Visits          4 months ago Dysfunction of both eustachian tubes   [facility] at [place], PA-C   6 months ago Dysfunction of both eustachian tubes   [facility] [place], PA-C   8 months ago Annual physical exam   [facility] at [place], PA-C   1 year ago Stress reaction   Primary Care at [facility], Arlie Solomons, MD   1 year ago Non-seasonal allergic rhinitis due to pollen   Primary Care at [facility], Arlie Solomons, MD

## 2018-06-08 NOTE — Telephone Encounter (Signed)
Copied from [place] [id number]. Topic: Quick Communication - Rx Refill/Question >> Jun 08, 2018  1:05 PM Bea Graff, NT wrote: Medication: naproxen (NAPROSYN) 500 MG tablet, SYNTHROID 50 MCG tablet, SUMAtriptan (IMITREX) 50 MG tablet   Has the patient contacted their pharmacy? Yes.   (Agent: If no, request that the patient contact the pharmacy for the refill.) (Agent: If yes, when and what did the pharmacy advise?)  Preferred Pharmacy (with phone number or street name): [facility], [place] [phone] (Phone) [phone] (Fax)    Agent: Please be advised that RX refills may take up to 3 business days. We ask that you follow-up with your pharmacy.

## 2018-06-14 MED ORDER — SUMATRIPTAN SUCCINATE 50 MG PO TABS: 50.0000 mg | ORAL_TABLET | ORAL | 0 refills | Status: AC | PRN

## 2018-06-14 MED ORDER — SYNTHROID 50 MCG PO TABS: ORAL_TABLET | ORAL | 0 refills | Status: AC

## 2018-06-14 MED ORDER — NAPROXEN 500 MG PO TABS: 500.0000 mg | ORAL_TABLET | Freq: Two times a day (BID) | ORAL | 0 refills | Status: AC

## 2018-06-14 NOTE — Telephone Encounter (Signed)
Refillf request for naproxen 500 mg #30, sumatriptan 50 mg #30, and levothyroxine 50 mcg # 30 with no refills approved.  Will send to schedulers to call pt and schedule f/u appt.  Pt needs to pick a new pcp. Dgaddy, CMA

## 2018-06-15 NOTE — Telephone Encounter (Signed)
Continuation of phone message from below ( Epic shut down)... Sorry for the typos.   When pt calls back, please make her an appt with one of the providers that are accepting new patients. Thank you!

## 2018-06-15 NOTE — Telephone Encounter (Signed)
LVM for pt to advise of her refilled RX's. I also advised that she would need to call the office to make an appt with a new provider in porder to get any new refills/establish care. She has seen Dr. Nolon Rod in the past so she may go back to her or she may pick Dr. Pamella Pert pof Dr. Mitchel Honour.

## 2018-07-20 DIAGNOSIS — G43009 Migraine without aura, not intractable, without status migrainosus: Secondary | ICD-10-CM | POA: Diagnosis not present

## 2018-07-20 DIAGNOSIS — J4520 Mild intermittent asthma, uncomplicated: Secondary | ICD-10-CM | POA: Diagnosis not present

## 2018-07-20 DIAGNOSIS — D649 Anemia, unspecified: Secondary | ICD-10-CM | POA: Diagnosis not present

## 2018-07-20 DIAGNOSIS — E039 Hypothyroidism, unspecified: Secondary | ICD-10-CM | POA: Diagnosis not present

## 2018-08-20 ENCOUNTER — Emergency Department (HOSPITAL_BASED_OUTPATIENT_CLINIC_OR_DEPARTMENT_OTHER)
Admission: EM | Admit: 2018-08-20 | Discharge: 2018-08-20 | Disposition: A | Discharge status: Home / Self Care | Payer: Federal, State, Local not specified - PPO | Attending: Emergency Medicine | Admitting: Emergency Medicine

## 2018-08-20 ENCOUNTER — Emergency Department (HOSPITAL_BASED_OUTPATIENT_CLINIC_OR_DEPARTMENT_OTHER): Payer: Federal, State, Local not specified - PPO

## 2018-08-20 ENCOUNTER — Encounter (HOSPITAL_BASED_OUTPATIENT_CLINIC_OR_DEPARTMENT_OTHER): Payer: Self-pay | Admitting: Emergency Medicine

## 2018-08-20 ENCOUNTER — Other Ambulatory Visit: Payer: Self-pay

## 2018-08-20 DIAGNOSIS — J45909 Unspecified asthma, uncomplicated: Secondary | ICD-10-CM | POA: Insufficient documentation

## 2018-08-20 DIAGNOSIS — Z79899 Other long term (current) drug therapy: Secondary | ICD-10-CM | POA: Diagnosis not present

## 2018-08-20 DIAGNOSIS — R0789 Other chest pain: Secondary | ICD-10-CM | POA: Insufficient documentation

## 2018-08-20 DIAGNOSIS — E039 Hypothyroidism, unspecified: Secondary | ICD-10-CM | POA: Insufficient documentation

## 2018-08-20 DIAGNOSIS — R200 Anesthesia of skin: Secondary | ICD-10-CM | POA: Diagnosis not present

## 2018-08-20 DIAGNOSIS — R079 Chest pain, unspecified: Secondary | ICD-10-CM

## 2018-08-20 LAB — CBC
HCT: 44.8 % (ref 36.0–46.0)
Hemoglobin: 14.1 g/dL (ref 12.0–15.0)
MCH: 28.3 pg (ref 26.0–34.0)
MCHC: 31.5 g/dL (ref 30.0–36.0)
MCV: 90 fL (ref 80.0–100.0)
NRBC: 0 % (ref 0.0–0.2)
PLATELETS: 355 10*3/uL (ref 150–400)
RBC: 4.98 MIL/uL (ref 3.87–5.11)
RDW: 13.2 % (ref 11.5–15.5)
WBC: 5.5 10*3/uL (ref 4.0–10.5)

## 2018-08-20 LAB — BASIC METABOLIC PANEL
Anion gap: 5 (ref 5–15)
BUN: 13 mg/dL (ref 6–20)
CO2: 27 mmol/L (ref 22–32)
Calcium: 8.9 mg/dL (ref 8.9–10.3)
Chloride: 103 mmol/L (ref 98–111)
Creatinine, Ser: 0.65 mg/dL (ref 0.44–1.00)
GFR calc non Af Amer: >60 mL/min (ref >60)
Glucose, Bld: 88 mg/dL (ref 70–99)
Potassium: 3.5 mmol/L (ref 3.5–5.1)
Sodium: 135 mmol/L (ref 135–145)

## 2018-08-20 LAB — PREGNANCY, URINE: Preg Test, Ur: NEGATIVE

## 2018-08-20 LAB — TROPONIN I
Troponin I: <0.03 ng/mL (ref <0.03)
Troponin I: <0.03 ng/mL (ref <0.03)

## 2018-08-20 MED ORDER — SODIUM CHLORIDE 0.9% FLUSH
3.0000 mL | Freq: Once | INTRAVENOUS | Status: DC
  Filled 2018-08-20: qty 3

## 2018-08-20 NOTE — ED Triage Notes (Signed)
Reports chest pain which began today.  C/o shortness of breath.  Denies nausea, vomiting.  Refused wheelchair to triage.  Ambulatory, in NAD.

## 2018-08-20 NOTE — Discharge Instructions (Addendum)
Follow-up with the [facility] cardiology clinic.  Their contact information has been provided in this discharge summary for you to call and make these arrangements.  Return to the ER in the meantime if your symptoms significantly worsen or change in the meantime.

## 2018-08-20 NOTE — Progress Notes (Signed)
Cardiology Office Note  Date:  08/23/2018   ID:  [name], DOB: 12-May-1966, MRN: [id number]  PCP:  Mancel Bale, PA-C   Chief Complaint  Patient presents with  . New Patient (Initial Visit)    hosp. F/U Chest Pain discomfort constantly with increasing pain off and on. Left arm /finger numbness.Medications reviewed verbally with the patient.     HPI:  [name] is a 53 y.o. female with a history of: Migraine headaches Hypothyroidism Anemia-NOS Depression Asthma GERD Who presents to the office to for acute chest pain  INTERVAL HISTORY: She presented to the ED on 08/20/2018 for chest pain and shortness of breath. Stated there was numbness in her left pinky finger. The chest pain radiated from her chest to her back and left arm. This all began shortly after waking up. Denied any exertional symptoms. She has no prior cardiac history, but does have a family history with two brothers having had Mis and father dying from heart disease in his 76.    The patient reports today for an initial visit for her acute chest pain. Denies that she was doing any heavy lifting the week prior to the chest pain. She has been carrying her 28 y/o daughter with no chest pain. A brother passed away 3 years ago, was diabetic, and her mother has had a stroke at 52. She is currently sore in the area around her heart. Denies current back pain. The pain started a week before going to the ED. She can feel the pain when she twists her torso. There is tightness around her spine.    Endorses having a normal blood pressure at home. Has three siblings that do have hypertension. Reports high stress from work. Former smoker who only smoked for 7 years, quit 20 years ago.   She has lower back pain and was given CBD cream once which gave immediate relief. It has caused some issue when carrying daughter.    Had a CT abdomen in March 2011 that showed no plaque build-up.  Images pulled up in the office today and discussed  with her in detail  Will be starting yoga this week and will work on improving diet.   Today's Blood pressure 156/84 Total Chol 180/ LDL 103 HBA1C 5.1 CR 0.65 Glucose 88   EKG personally reviewed by myself on todays visit Shows normal sinus rhythm. 63 bpm.    OTHER PAST MEDICAL HISTORY REVIEWED BY ME FOR TODAY'S VISIT:   PMH:   has a past medical history of Allergy, Anemia, Depression, Fibroids, Frequent headaches, and Thyroid disease.  PSH:    Past Surgical History:  Procedure Laterality Date  . MYOMECTOMY    . WISDOM TOOTH EXTRACTION      Current Outpatient Medications  Medication Sig Dispense Refill  . naproxen (NAPROSYN) 500 MG tablet Take 1 tablet (500 mg total) by mouth 2 (two) times daily with a meal. 30 tablet 0  . SUMAtriptan (IMITREX) 50 MG tablet Take 1 tablet (50 mg total) by mouth every 2 (two) hours as needed for migraine. May repeat in 2 hours if headache persists or recurs. 30 tablet 0  . SYNTHROID 50 MCG tablet TAKE 1 TABLET BY MOUTH ONCE DAILY BEFORE BREAKFAST 30 tablet 0  . azelastine (ASTELIN) 0.1 % nasal spray Place 2 sprays into both nostrils 2 (two) times daily. Use in each nostril as directed (Patient not taking: Reported on 08/23/2018) 30 mL 12   No current facility-administered medications for this visit.  ALLERGIES:   Codeine and Singulair [montelukast sodium]   SOCIAL HISTORY:  The patient  reports that she has never smoked. She has never used smokeless tobacco. She reports current alcohol use. She reports that she does not use drugs.   FAMILY HISTORY:   family history includes Breast cancer in her maternal aunt; Breast cancer (age of onset: 62) in her mother; Cancer (age of onset: 43) in her mother; Diabetes in her brother, maternal grandfather, and maternal grandmother; Heart disease in her father; Heart disease (age of onset: 64) in her brother; Mental illness in her brother; Stroke in her maternal grandmother and mother.    REVIEW OF  SYSTEMS: Review of Systems  Constitutional: Negative.   Eyes: Negative.   Respiratory: Negative.   Cardiovascular: Positive for chest pain.  Gastrointestinal: Negative.   Genitourinary: Negative.   Musculoskeletal: Negative.   Neurological: Negative.   Psychiatric/Behavioral: Negative.   All other systems reviewed and are negative.    PHYSICAL EXAM: VS:  BP (!) 156/84 (BP Location: Right Arm, Patient Position: Sitting, Cuff Size: Normal)   Pulse 63   Ht 5' 5.75" (1.67 m)   Wt 200 lb (90.7 kg)   LMP 10/06/2017   BMI 32.53 kg/m  , BMI Body mass index is 32.53 kg/m.  GEN: Well nourished, well developed, in no acute distress HEENT: normal Neck: no JVD, carotid bruits, or masses Cardiac: RRR; no murmurs, rubs, or gallops,no edema  Respiratory:  clear to auscultation bilaterally, normal work of breathing GI: soft, nontender, nondistended, + BS MS: no deformity or atrophy Skin: warm and dry, no rash Neuro:  Strength and sensation are intact Psych: euthymic mood, full affect    RECENT LABS: 10/06/2017: ALT 10; TSH 0.736 08/20/2018: BUN 13; Creatinine, Ser 0.65; Hemoglobin 14.1; Platelets 355; Potassium 3.5; Sodium 135    LIPID PANEL: Lab Results  Component Value Date   CHOL 180 10/06/2017   HDL 66 10/06/2017   LDLCALC 103 (H) 10/06/2017   TRIG 53 10/06/2017      WEIGHT: Wt Readings from Last 3 Encounters:  08/23/18 200 lb (90.7 kg)  08/20/18 192 lb (87.1 kg)  02/02/18 195 lb 6.4 oz (88.6 kg)       ASSESSMENT AND PLAN:  1. Chest pain, unspecified type Atypical pain, reproduced with palpation left chest also radiating around to her back likely rib pain Recommended NSAIDs, icing, light stretching If no improvement in her symptoms may need massage and chiropractic Significant paravertebral muscle spasms posterior shoulders Discussed with her in detail Carries her two-year-old child frequently likely leading to symptoms - EKG 12-Lead  2. Other migraine without  status migrainosus, not intractable Relatively well controlled, by primary care  3. Hypothyroidism, unspecified type Managed by primary care  4. Family history of coronary arteriosclerosis Risk factors discussed with her Prior smoker but stopped many years ago, cholesterol very reasonably well controlled, not a diabetic.  Was reviewed with her Suggested possible screening studies If she is interested recommended she call us for CT coronary calcium scoring   Disposition:   F/U  PRN  Total encounter time more than 45 minutes. Greater than 50% was spent in counseling and coordination of care with the patient.    Orders Placed This Encounter  Procedures  . EKG 12-Lead     I, Jesus Reyes am acting as a scribe for Ida Rogue, M.D., Ph.D.  I, Ida Rogue, M.D. Ph.D., have reviewed the above documentation for accuracy and completeness, and I agree with the above.  Signed, Esmond Plants, M.D., Ph.D. 08/23/2018  [place], [place]

## 2018-08-20 NOTE — ED Provider Notes (Signed)
[facility] EMERGENCY DEPARTMENT Provider Note   CSN: [id number] Arrival date & time: 08/20/18  1058    History   Chief Complaint Chief Complaint  Patient presents with  . Chest Pain    HPI [name] is a 53 y.o. female.     Patient is a 53 year old female with past medical history of hypothyroidism, migraines, GERD, and depression.  She presents today for evaluation of chest discomfort.  This began this morning shortly after waking from sleep.  She states she feels somewhat short of breath.  She describes the discomfort as a pressure with no radiation to the arm or jaw, but does tell me that her little finger on her left hand feels "numb".  She denies any exertional symptoms.  She has no prior cardiac history, but does have a family history with 2 brothers having had MIs and father dying from heart disease in his 40s.  The history is provided by the patient.  Chest Pain  Pain location:  Substernal area Pain quality: pressure   Pain radiates to:  Does not radiate Pain severity:  Moderate Timing:  Constant Progression:  Unchanged Chronicity:  New Relieved by:  Nothing Worsened by:  Nothing Ineffective treatments:  None tried   Past Medical History:  Diagnosis Date  . Allergy   . Anemia   . Depression   . Fibroids   . Frequent headaches   . Thyroid disease     Patient Active Problem List   Diagnosis Date Noted  . Hypothyroidism 03/11/2010  . ANEMIA-NOS 03/11/2010  . DEPRESSION 03/11/2010  . Migraine headache 03/11/2010  . ASTHMA 03/11/2010  . GERD 03/11/2010    Past Surgical History:  Procedure Laterality Date  . MYOMECTOMY    . WISDOM TOOTH EXTRACTION       OB History   No obstetric history on file.      Home Medications    Prior to Admission medications   Medication Sig Start Date End Date Taking? Authorizing Provider  ALPRAZolam (XANAX) 0.25 MG tablet Take 1 tablet (0.25 mg total) by mouth 2 (two) times daily as needed for  anxiety (panic attacks). 06/01/17   Forrest Moron, MD  azelastine (ASTELIN) 0.1 % nasal spray Place 2 sprays into both nostrils 2 (two) times daily. Use in each nostril as directed 02/02/18   Weber, Damaris Hippo, PA-C  b complex vitamins capsule Take 1 capsule by mouth daily.    [provider]  cetirizine-pseudoephedrine (ZYRTEC-D ALLERGY & CONGESTION) 5-120 MG tablet Take 1 tablet by mouth 2 (two) times daily. 02/02/18   Weber, Damaris Hippo, PA-C  Cholecalciferol (VITAMIN D PO) Take by mouth.    [provider]  naproxen (NAPROSYN) 500 MG tablet Take 1 tablet (500 mg total) by mouth 2 (two) times daily with a meal. 06/14/18   Stallings, Zoe A, MD  scopolamine (TRANSDERM-SCOP, 1.5 MG,) 1 MG/3DAYS Place 1 patch (1.5 mg total) onto the skin every 3 (three) days. 02/02/18   Weber, Damaris Hippo, PA-C  SUMAtriptan (IMITREX) 50 MG tablet Take 1 tablet (50 mg total) by mouth every 2 (two) hours as needed for migraine. May repeat in 2 hours if headache persists or recurs. 06/14/18   Forrest Moron, MD  SYNTHROID 50 MCG tablet TAKE 1 TABLET BY MOUTH ONCE DAILY BEFORE BREAKFAST 06/14/18   Delia Chimes A, MD  triamcinolone (NASACORT) 55 MCG/ACT AERO nasal inhaler Place 2 sprays into the nose daily. 02/02/18   Mancel Bale, PA-C  Family History Family History  Problem Relation Age of Onset  . Cancer Mother 69       breast  . Stroke Mother   . Breast cancer Mother 11  . Heart disease Father   . Diabetes Brother   . Heart disease Brother 76       massive MI  . Mental illness Brother   . Diabetes Maternal Grandmother   . Stroke Maternal Grandmother   . Diabetes Maternal Grandfather   . Breast cancer Maternal Aunt        3 MA in 2's    Social History Social History   Tobacco Use  . Smoking status: Never Smoker  . Smokeless tobacco: Never Used  Substance Use Topics  . Alcohol use: Yes    Alcohol/week: 0.0 standard drinks    Comment: rare social  . Drug use: No     Allergies     Codeine and Singulair [montelukast sodium]   Review of Systems Review of Systems  Cardiovascular: Positive for chest pain.  All other systems reviewed and are negative.    Physical Exam Updated Vital Signs BP (!) 151/104 (BP Location: Left Arm)   Pulse 66   Temp 97.8 F (36.6 C) (Oral)   Resp 16   Ht 5\' 5"  (1.651 m)   Wt 87.1 kg   LMP 10/06/2017   SpO2 100%   BMI 31.95 kg/m   Physical Exam Vitals signs and nursing note reviewed.  Constitutional:      General: She is not in acute distress.    Appearance: She is well-developed. She is not diaphoretic.  HENT:     Head: Normocephalic and atraumatic.  Neck:     Musculoskeletal: Normal range of motion and neck supple.  Cardiovascular:     Rate and Rhythm: Normal rate and regular rhythm.     Heart sounds: No murmur. No friction rub. No gallop.   Pulmonary:     Effort: Pulmonary effort is normal. No respiratory distress.     Breath sounds: Normal breath sounds. No wheezing.  Abdominal:     General: Bowel sounds are normal. There is no distension.     Palpations: Abdomen is soft.     Tenderness: There is no abdominal tenderness.  Musculoskeletal: Normal range of motion.     Right lower leg: She exhibits no tenderness. No edema.     Left lower leg: She exhibits no tenderness. No edema.  Skin:    General: Skin is warm and dry.  Neurological:     Mental Status: She is alert and oriented to person, place, and time.      ED Treatments / Results  Labs (all labs ordered are listed, but only abnormal results are displayed) Labs Reviewed  BASIC METABOLIC PANEL  CBC  TROPONIN I  PREGNANCY, URINE  TROPONIN I    EKG EKG Interpretation  Date/Time:  Monday August 20 2018 11:06:40 EST Ventricular Rate:  66 PR Interval:  170 QRS Duration: 72 QT Interval:  392 QTC Calculation: 410 R Axis:   22 Text Interpretation:  Normal sinus rhythm Low voltage QRS Nonspecific ST and T wave abnormality Abnormal ECG When compared to  prior, no significaint changes seen.  No STEMI Confirmed by Antony Blackbird [phone]) on 08/20/2018 11:33:49 AM   Radiology Dg Chest 2 View  Result Date: 08/20/2018 CLINICAL DATA:  Chest and LEFT arm pain with numbness since last week EXAM: CHEST - 2 VIEW COMPARISON:  01/21/2009 FINDINGS: Normal heart size, mediastinal contours,  and pulmonary vascularity. Lungs clear. No pleural effusion or pneumothorax. Bones unremarkable. IMPRESSION: Normal exam. Electronically Signed   By: Lavonia Dana M.D.   On: 08/20/2018 11:23    Procedures Procedures (including critical care time)  Medications Ordered in ED Medications - No data to display   Initial Impression / Assessment and Plan / ED Course  I have reviewed the triage vital signs and the nursing notes.  Pertinent labs & imaging results that were available during my care of the patient were reviewed by me and considered in my medical decision making (see chart for details).  Patient presenting here with complaints of chest discomfort and numbness to her left fifth finger.  This is been ongoing since this morning.  Her neurologic exam is otherwise nonfocal and I highly doubt stroke.  She has had an unchanged EKG and negative troponin x2.  Patient does have risk factors for heart disease that I feel a stress test/cardiology follow-up would be appropriate.  She will be given the number for the [facility] health cardiology clinic with whom she can follow-up as an outpatient and return to the ER in the meantime if she worsens.  Final Clinical Impressions(s) / ED Diagnoses   Final diagnoses:  None    ED Discharge Orders    None       Veryl Speak, MD 08/20/18 1620

## 2018-08-20 NOTE — ED Notes (Signed)
ED Provider at bedside. 

## 2018-08-23 ENCOUNTER — Encounter: Payer: Self-pay | Admitting: *Deleted

## 2018-08-23 ENCOUNTER — Encounter: Payer: Self-pay | Admitting: Cardiovascular Disease

## 2018-08-23 ENCOUNTER — Ambulatory Visit (INDEPENDENT_AMBULATORY_CARE_PROVIDER_SITE_OTHER): Payer: Federal, State, Local not specified - PPO | Admitting: Cardiovascular Disease

## 2018-08-23 VITALS — BP 156/84 | HR 63 | Ht 65.75 in | Wt 200.0 lb

## 2018-08-23 DIAGNOSIS — E039 Hypothyroidism, unspecified: Secondary | ICD-10-CM | POA: Diagnosis not present

## 2018-08-23 DIAGNOSIS — R079 Chest pain, unspecified: Secondary | ICD-10-CM

## 2018-08-23 DIAGNOSIS — Z8249 Family history of ischemic heart disease and other diseases of the circulatory system: Secondary | ICD-10-CM

## 2018-08-23 DIAGNOSIS — G43809 Other migraine, not intractable, without status migrainosus: Secondary | ICD-10-CM | POA: Diagnosis not present

## 2018-08-23 NOTE — Patient Instructions (Addendum)
Talk with chiropractic/massage about back  Think about a CT coronary calcium score for screening $150, in [place] 3rd floor [place]   Medication Instructions:  No changes  If you need a refill on your cardiac medications before your next appointment, please call your pharmacy.    Lab work: No new labs needed   If you have labs (blood work) drawn today and your tests are completely normal, you will receive your results only by: Marland Kitchen MyChart Message (if you have MyChart) OR . A paper copy in the mail If you have any lab test that is abnormal or we need to change your treatment, we will call you to review the results.   Testing/Procedures: No new testing needed   Follow-Up: At [facility], you and your health needs are our priority.  As part of our continuing mission to provide you with exceptional heart care, we have created designated Provider Care Teams.  These Care Teams include your primary Cardiologist (physician) and Advanced Practice Providers (APPs -  Physician Assistants and Nurse Practitioners) who all work together to provide you with the care you need, when you need it.  . You will need a follow up appointment as needed  . Providers on your designated Care Team:   . Murray Hodgkins, NP . Christell Faith, PA-C . Marrianne Mood, PA-C  Any Other Special Instructions Will Be Listed Below (If Applicable).  For educational health videos Log in to : [email or web address] Or : [email or web address], password : [email or web address]

## 2018-10-15 DIAGNOSIS — G43009 Migraine without aura, not intractable, without status migrainosus: Secondary | ICD-10-CM | POA: Diagnosis not present

## 2018-10-15 DIAGNOSIS — M545 Low back pain: Secondary | ICD-10-CM | POA: Diagnosis not present

## 2019-03-12 DIAGNOSIS — E559 Vitamin D deficiency, unspecified: Secondary | ICD-10-CM | POA: Diagnosis not present

## 2019-03-12 DIAGNOSIS — E039 Hypothyroidism, unspecified: Secondary | ICD-10-CM | POA: Diagnosis not present

## 2019-03-12 DIAGNOSIS — R635 Abnormal weight gain: Secondary | ICD-10-CM | POA: Diagnosis not present

## 2019-03-12 DIAGNOSIS — N951 Menopausal and female climacteric states: Secondary | ICD-10-CM | POA: Diagnosis not present

## 2019-03-12 DIAGNOSIS — R5383 Other fatigue: Secondary | ICD-10-CM | POA: Diagnosis not present

## 2019-03-27 DIAGNOSIS — Z1339 Encounter for screening examination for other mental health and behavioral disorders: Secondary | ICD-10-CM | POA: Diagnosis not present

## 2019-03-27 DIAGNOSIS — E7800 Pure hypercholesterolemia, unspecified: Secondary | ICD-10-CM | POA: Diagnosis not present

## 2019-03-27 DIAGNOSIS — R232 Flushing: Secondary | ICD-10-CM | POA: Diagnosis not present

## 2019-03-27 DIAGNOSIS — Z1331 Encounter for screening for depression: Secondary | ICD-10-CM | POA: Diagnosis not present

## 2019-03-27 DIAGNOSIS — E559 Vitamin D deficiency, unspecified: Secondary | ICD-10-CM | POA: Diagnosis not present

## 2019-03-27 DIAGNOSIS — N951 Menopausal and female climacteric states: Secondary | ICD-10-CM | POA: Diagnosis not present

## 2019-04-01 DIAGNOSIS — Z6832 Body mass index (BMI) 32.0-32.9, adult: Secondary | ICD-10-CM | POA: Diagnosis not present

## 2019-04-01 DIAGNOSIS — Z0000 Encounter for general adult medical examination without abnormal findings: Secondary | ICD-10-CM | POA: Diagnosis not present

## 2019-04-01 DIAGNOSIS — Z209 Contact with and (suspected) exposure to unspecified communicable disease: Secondary | ICD-10-CM | POA: Diagnosis not present

## 2019-04-01 DIAGNOSIS — Z23 Encounter for immunization: Secondary | ICD-10-CM | POA: Diagnosis not present

## 2019-04-01 DIAGNOSIS — E7800 Pure hypercholesterolemia, unspecified: Secondary | ICD-10-CM | POA: Diagnosis not present

## 2019-04-01 DIAGNOSIS — R079 Chest pain, unspecified: Secondary | ICD-10-CM | POA: Diagnosis not present

## 2019-04-01 DIAGNOSIS — G43009 Migraine without aura, not intractable, without status migrainosus: Secondary | ICD-10-CM | POA: Diagnosis not present

## 2019-04-01 DIAGNOSIS — Z111 Encounter for screening for respiratory tuberculosis: Secondary | ICD-10-CM | POA: Diagnosis not present

## 2019-04-01 DIAGNOSIS — Z1211 Encounter for screening for malignant neoplasm of colon: Secondary | ICD-10-CM | POA: Diagnosis not present

## 2019-04-01 DIAGNOSIS — Z8249 Family history of ischemic heart disease and other diseases of the circulatory system: Secondary | ICD-10-CM | POA: Diagnosis not present

## 2019-04-12 ENCOUNTER — Other Ambulatory Visit: Payer: Self-pay

## 2019-04-12 DIAGNOSIS — Z20822 Contact with and (suspected) exposure to covid-19: Secondary | ICD-10-CM

## 2019-04-12 DIAGNOSIS — Z20828 Contact with and (suspected) exposure to other viral communicable diseases: Secondary | ICD-10-CM | POA: Diagnosis not present

## 2019-04-13 LAB — NOVEL CORONAVIRUS, NAA: SARS-CoV-2, NAA: NOT DETECTED

## 2019-08-05 DIAGNOSIS — M25562 Pain in left knee: Secondary | ICD-10-CM | POA: Diagnosis not present

## 2019-08-05 DIAGNOSIS — Z803 Family history of malignant neoplasm of breast: Secondary | ICD-10-CM | POA: Diagnosis not present

## 2019-08-05 DIAGNOSIS — M25561 Pain in right knee: Secondary | ICD-10-CM | POA: Diagnosis not present

## 2019-08-05 DIAGNOSIS — D649 Anemia, unspecified: Secondary | ICD-10-CM | POA: Diagnosis not present

## 2019-08-05 DIAGNOSIS — J4520 Mild intermittent asthma, uncomplicated: Secondary | ICD-10-CM | POA: Diagnosis not present

## 2019-08-14 ENCOUNTER — Other Ambulatory Visit: Payer: Self-pay | Admitting: Physician Assistant

## 2019-08-14 ENCOUNTER — Telehealth: Payer: Self-pay | Admitting: Licensed Clinical Social Worker

## 2019-08-14 DIAGNOSIS — Z1231 Encounter for screening mammogram for malignant neoplasm of breast: Secondary | ICD-10-CM

## 2019-08-14 NOTE — Telephone Encounter (Signed)
Received a genetic counseling referral from [facility] for fhx of breast cancer. Pt has been cld and scheduled to see Raquel Sarna on 3/4 at 1pm for a virtual visit. I verified that the pt has an active mychart acct and verified her mobile number.

## 2019-08-19 ENCOUNTER — Ambulatory Visit
Admission: RE | Admit: 2019-08-19 | Discharge: 2019-08-19 | Disposition: A | Discharge status: Home / Self Care | Payer: Federal, State, Local not specified - PPO | Source: Ambulatory Visit | Attending: Physician Assistant | Admitting: Physician Assistant

## 2019-08-19 ENCOUNTER — Other Ambulatory Visit: Payer: Self-pay

## 2019-08-19 DIAGNOSIS — Z1231 Encounter for screening mammogram for malignant neoplasm of breast: Secondary | ICD-10-CM

## 2019-08-22 ENCOUNTER — Ambulatory Visit (HOSPITAL_BASED_OUTPATIENT_CLINIC_OR_DEPARTMENT_OTHER): Payer: Federal, State, Local not specified - PPO | Admitting: Licensed Clinical Social Worker

## 2019-08-22 ENCOUNTER — Encounter: Payer: Self-pay | Admitting: Licensed Clinical Social Worker

## 2019-08-22 DIAGNOSIS — Z800 Family history of malignant neoplasm of digestive organs: Secondary | ICD-10-CM

## 2019-08-22 DIAGNOSIS — Z8041 Family history of malignant neoplasm of ovary: Secondary | ICD-10-CM | POA: Diagnosis not present

## 2019-08-22 DIAGNOSIS — Z803 Family history of malignant neoplasm of breast: Secondary | ICD-10-CM | POA: Insufficient documentation

## 2019-08-22 NOTE — Progress Notes (Signed)
REFERRING PROVIDER: Mancel Bale, PA-C [place],  [place] [address]  PRIMARY PROVIDER:  Mancel Bale, PA-C  PRIMARY REASON FOR VISIT:  1. Family history of breast cancer   2. Family history of ovarian cancer   3. Family history of throat cancer     I connected with [name] on 08/22/2019 at 1:55 PM EDT by Webex and verified that I am speaking with the correct person using two identifiers.    Patient location: home Provider location: clinic  HISTORY OF PRESENT ILLNESS:   [name], a 54 y.o. female, was seen for a  cancer genetics consultation at the request of Windell Hummingbird PA-C due to a family history of cancer.  [name] presents to clinic today to discuss the possibility of a hereditary predisposition to cancer, genetic testing, and to further clarify her future cancer risks, as well as potential cancer risks for family members.    [name] is a 54 y.o. female with no personal history of cancer.    CANCER HISTORY:  Oncology History   No history exists.     RISK FACTORS:  Menarche was at age 76.  OCP use for approximately 20+ years.  Ovaries intact: yes. Hysterectomy: no.  Menopausal status: postmenopausal.  HRT use: 0 years. Colonoscopy: no; Cologuard normal this year. Mammogram within the last year: yes. Number of breast biopsies: 0. Up to date with pelvic exams: yes. Any excessive radiation exposure in the past: no  Past Medical History:  Diagnosis Date  . Allergy   . Anemia   . Depression   . Family history of breast cancer   . Family history of ovarian cancer   . Family history of throat cancer   . Fibroids   . Frequent headaches   . Thyroid disease     Past Surgical History:  Procedure Laterality Date  . MYOMECTOMY    . WISDOM TOOTH EXTRACTION      Social History   Socioeconomic History  . Marital status: Single    Spouse name: Not on file  . Number of children: 1  . Years of education: Not on file  .  Highest education level: Not on file  Occupational History  . Not on file  Tobacco Use  . Smoking status: Never Smoker  . Smokeless tobacco: Never Used  Substance and Sexual Activity  . Alcohol use: Yes    Alcohol/week: 0.0 standard drinks    Comment: rare social  . Drug use: No  . Sexual activity: Not Currently  Other Topics Concern  . Not on file  Social History Narrative   Fostering to adopt daughter June 2017   Social Determinants of Health   Financial Resource Strain:   . Difficulty of Paying Living Expenses: Not on file  Food Insecurity:   . Worried About Charity fundraiser in the Last Year: Not on file  . Ran Out of Food in the Last Year: Not on file  Transportation Needs:   . Lack of Transportation (Medical): Not on file  . Lack of Transportation (Non-Medical): Not on file  Physical Activity:   . Days of Exercise per Week: Not on file  . Minutes of Exercise per Session: Not on file  Stress:   . Feeling of Stress : Not on file  Social Connections:   . Frequency of Communication with Friends and Family: Not on file  . Frequency of Social Gatherings with Friends and Family: Not on  file  . Attends Religious Services: Not on file  . Active Member of Clubs or Organizations: Not on file  . Attends Archivist Meetings: Not on file  . Marital Status: Not on file     FAMILY HISTORY:  We obtained a detailed, 4-generation family history.  Significant diagnoses are listed below: Family History  Problem Relation Age of Onset  . Stroke Mother   . Breast cancer Mother        dx 27 in one breast, dx again in both 37  . Heart disease Father   . Diabetes Brother   . Heart disease Brother 83       massive MI  . Mental illness Brother   . Diabetes Maternal Grandmother   . Stroke Maternal Grandmother   . Diabetes Maternal Grandfather   . Breast cancer Maternal Aunt        dx 27  . Breast cancer Paternal Aunt 18  . Throat cancer Paternal Grandmother 35       no  hx smoking  . Cancer Paternal Grandfather        unk possible colon  . Breast cancer Maternal Aunt        dx 75  . Ovarian cancer Maternal Aunt    [name] has one adopted daughter, age 30. Patient had 3 brothers and 1 sister. One of her brothers is deceased due to a heart attack.   [name] mother was diagnosed with breast cancer in one breast at age 86, and then diagnosed again in both breasts at 52. She died recently at 25. Patient had 5 maternal uncles and 6 maternal aunts. Two of her aunts had breast cancer, one diagnosed in her 44s and the other in her 62s. Another aunt had ovarian cancer and is deceased. Another aunt has cancer currently, possibly a blood cancer. No known cancers in maternal cousins. Maternal grandmother died at 61 and grandfather died at 69 of a heart attack.  [name] father died at 50 he had history of smoking, COPD, and died of a heart attack. Patient had 2 paternal aunts. One aunt had breast cancer at 15 and died at 23. No known cancers in paternal cousins. Paternal grandmother had throat cancer at 36 and no history of smoking. Grandfather had cancer, unknown type but possibly colon, in his 69s.  [name] is unaware of previous family history of genetic testing for hereditary cancer risks. Patient's maternal ancestors are of [place] American and White descent, and paternal ancestors are of African American descent. There is no reported Ashkenazi Jewish ancestry. There is no known consanguinity.  GENETIC COUNSELING ASSESSMENT: [name] is a 54 y.o. female with a family history of breast and ovarian cancer which is somewhat suggestive of a hereditary cancer syndrome such as HBOC and predisposition to cancer. We, therefore, discussed and recommended the following at today's visit.   DISCUSSION: We discussed that 5 - 10% of breast cancer is hereditary, with most cases associated with the BRCA1/BRCA2 genes.  There are other genes that can be associated with  hereditary breast cancer syndromes.  We discussed that testing is beneficial for several reasons including knowing how to follow individuals for cancer screenings and understand if other family members could be at risk for cancer and allow them to undergo genetic testing.   We reviewed the characteristics, features and inheritance patterns of hereditary cancer syndromes. We also discussed genetic testing, including the appropriate family members to test, the process of testing,  insurance coverage and turn-around-time for results. We discussed the implications of a negative, positive and/or variant of uncertain significant result. We recommended [name] pursue genetic testing for the Common Hereditary Cancers gene panel.  The Common Hereditary Cancers Panel offered by [facility] includes sequencing and/or deletion duplication testing of the following 48 genes: APC, ATM, AXIN2, BARD1, BMPR1A, BRCA1, BRCA2, BRIP1, CDH1, CDKN2A (p14ARF), CDKN2A (p16INK4a), CKD4, CHEK2, CTNNA1, DICER1, EPCAM (Deletion/duplication testing only), GREM1 (promoter region deletion/duplication testing only), KIT, MEN1, MLH1, MSH2, MSH3, MSH6, MUTYH, NBN, NF1, NHTL1, PALB2, PDGFRA, PMS2, POLD1, POLE, PTEN, RAD50, RAD51C, RAD51D, RNF43, SDHB, SDHC, SDHD, SMAD4, SMARCA4. STK11, TP53, TSC1, TSC2, and VHL.  The following genes were evaluated for sequence changes only: SDHA and HOXB13 c.251G>A variant only.   Based on [name]'s family history of breast cancer, she meets medical criteria for genetic testing. Despite that she meets criteria, she may still have an out of pocket cost.   PLAN: After considering the risks, benefits, and limitations, [name] provided informed consent to pursue genetic testing. A saliva kit was mailed to the patient and the sample will be sent to [facility] for analysis of the Common Hereditary Cancers Panel. Results should be available within approximately 2-3 weeks' time, at which point they will  be disclosed by telephone to [name], as will any additional recommendations warranted by these results. [name] will receive a summary of her genetic counseling visit and a copy of her results once available. This information will also be available in Epic.   Based on [name]'s family history, we recommended her maternal relatives have genetic counseling and testing. [name] will let us know if we can be of any assistance in coordinating genetic counseling and/or testing for this family member.   [name] questions were answered to her satisfaction today. Our contact information was provided should additional questions or concerns arise. Thank you for the referral and allowing [place] to share in the care of your patient.   Miranda Rogue, MS, [facility] Genetic Counselor [email or web address] Phone: [phone]  The patient was seen for a total of 35 minutes in face-to-face genetic counseling.  Drs. Magrinat, Lindi Adie and/or Burr Medico were available for discussion regarding this case.   _______________________________________________________________________ For Office Staff:  Number of people involved in session: 1 Was an Intern/ student involved with case: no

## 2019-09-30 ENCOUNTER — Other Ambulatory Visit: Payer: Self-pay

## 2019-09-30 ENCOUNTER — Ambulatory Visit: Payer: Federal, State, Local not specified - PPO

## 2019-09-30 ENCOUNTER — Ambulatory Visit
Admission: RE | Admit: 2019-09-30 | Discharge: 2019-09-30 | Disposition: A | Discharge status: Home / Self Care | Payer: Federal, State, Local not specified - PPO | Source: Ambulatory Visit | Attending: Physician Assistant | Admitting: Physician Assistant

## 2019-09-30 DIAGNOSIS — Z1231 Encounter for screening mammogram for malignant neoplasm of breast: Secondary | ICD-10-CM | POA: Diagnosis not present

## 2019-11-19 DIAGNOSIS — M791 Myalgia, unspecified site: Secondary | ICD-10-CM | POA: Diagnosis not present

## 2019-11-19 DIAGNOSIS — R239 Unspecified skin changes: Secondary | ICD-10-CM | POA: Diagnosis not present

## 2019-11-19 DIAGNOSIS — R591 Generalized enlarged lymph nodes: Secondary | ICD-10-CM | POA: Diagnosis not present

## 2019-11-19 DIAGNOSIS — Z23 Encounter for immunization: Secondary | ICD-10-CM | POA: Diagnosis not present

## 2019-11-19 DIAGNOSIS — M2550 Pain in unspecified joint: Secondary | ICD-10-CM | POA: Diagnosis not present

## 2019-11-20 ENCOUNTER — Encounter: Payer: Self-pay | Admitting: Licensed Clinical Social Worker

## 2019-11-27 DIAGNOSIS — R6884 Jaw pain: Secondary | ICD-10-CM | POA: Diagnosis not present

## 2019-11-27 DIAGNOSIS — G43909 Migraine, unspecified, not intractable, without status migrainosus: Secondary | ICD-10-CM | POA: Diagnosis not present

## 2019-11-27 DIAGNOSIS — M542 Cervicalgia: Secondary | ICD-10-CM | POA: Diagnosis not present

## 2019-11-29 DIAGNOSIS — R6884 Jaw pain: Secondary | ICD-10-CM | POA: Diagnosis not present

## 2019-11-29 DIAGNOSIS — M542 Cervicalgia: Secondary | ICD-10-CM | POA: Diagnosis not present

## 2019-11-29 DIAGNOSIS — G43909 Migraine, unspecified, not intractable, without status migrainosus: Secondary | ICD-10-CM | POA: Diagnosis not present

## 2019-12-02 ENCOUNTER — Telehealth: Payer: Self-pay | Admitting: Licensed Clinical Social Worker

## 2019-12-03 DIAGNOSIS — G43909 Migraine, unspecified, not intractable, without status migrainosus: Secondary | ICD-10-CM | POA: Diagnosis not present

## 2019-12-03 DIAGNOSIS — R6884 Jaw pain: Secondary | ICD-10-CM | POA: Diagnosis not present

## 2019-12-03 DIAGNOSIS — M542 Cervicalgia: Secondary | ICD-10-CM | POA: Diagnosis not present

## 2019-12-04 ENCOUNTER — Ambulatory Visit: Payer: Self-pay | Admitting: Licensed Clinical Social Worker

## 2019-12-04 ENCOUNTER — Encounter: Payer: Self-pay | Admitting: Licensed Clinical Social Worker

## 2019-12-04 DIAGNOSIS — Z8041 Family history of malignant neoplasm of ovary: Secondary | ICD-10-CM

## 2019-12-04 DIAGNOSIS — Z1379 Encounter for other screening for genetic and chromosomal anomalies: Secondary | ICD-10-CM

## 2019-12-04 DIAGNOSIS — Z803 Family history of malignant neoplasm of breast: Secondary | ICD-10-CM

## 2019-12-04 DIAGNOSIS — Z800 Family history of malignant neoplasm of digestive organs: Secondary | ICD-10-CM

## 2019-12-04 NOTE — Telephone Encounter (Signed)
Revealed negative genetic testing.   We discussed that we do not know why there is cancer in the family. It could be due to a different gene that we are not testing, or something our current technology cannot pick up. There could also be a hereditary cause for the cancer in her family that she did not inherit. It will be important for her to keep in contact with genetics to learn if additional testing may be needed in the future. She is also considered high risk for breast cancer Tobey Bride Cuzick score 29%) and was offered referral to the high risk breast clinic but would prefer to discuss with her referring provider, Windell Hummingbird PA-C.

## 2019-12-04 NOTE — Progress Notes (Signed)
HPI:  [name] was previously seen in the [place] clinic due to a family history of cancer and concerns regarding a hereditary predisposition to cancer. Please refer to our prior cancer genetics clinic note for more information regarding our discussion, assessment and recommendations, at the time. [name] recent genetic test results were disclosed to her, as were recommendations warranted by these results. These results and recommendations are discussed in more detail below.  CANCER HISTORY:  Oncology History   No history exists.    FAMILY HISTORY:  We obtained a detailed, 4-generation family history.  Significant diagnoses are listed below: Family History  Problem Relation Age of Onset  . Stroke Mother   . Breast cancer Mother        dx 45 in one breast, dx again in both 33  . Heart disease Father   . Diabetes Brother   . Heart disease Brother 11       massive MI  . Mental illness Brother   . Diabetes Maternal Grandmother   . Stroke Maternal Grandmother   . Diabetes Maternal Grandfather   . Breast cancer Maternal Aunt        dx 10  . Breast cancer Paternal Aunt 36  . Throat cancer Paternal Grandmother 39       no hx smoking  . Cancer Paternal Grandfather        unk possible colon  . Breast cancer Maternal Aunt        dx 15  . Ovarian cancer Maternal Aunt    [name] has one adopted daughter, age 62. Patient had 3 brothers and 1 sister. One of her brothers is deceased due to a heart attack.   [name] mother was diagnosed with breast cancer in one breast at age 44, and then diagnosed again in both breasts at 53. She died recently at 58. Patient had 5 maternal uncles and 6 maternal aunts. Two of her aunts had breast cancer, one diagnosed in her 27s and the other in her 89s. Another aunt had ovarian cancer and is deceased. Another aunt has cancer currently, possibly a blood cancer. No known cancers in maternal cousins. Maternal grandmother died at 47  and grandfather died at 46 of a heart attack.  [name] father died at 59 he had history of smoking, COPD, and died of a heart attack. Patient had 2 paternal aunts. One aunt had breast cancer at 25 and died at 32. No known cancers in paternal cousins. Paternal grandmother had throat cancer at 40 and no history of smoking. Grandfather had cancer, unknown type but possibly colon, in his 51s.  [name] is unaware of previous family history of genetic testing for hereditary cancer risks. Patient's maternal ancestors are of [place] American and White descent, and paternal ancestors are of African American descent. There is no reported Ashkenazi Jewish ancestry. There is no known consanguinity.   GENETIC TEST RESULTS: Genetic testing reported out on 12/02/2019 through the [facility] Common Hereditary cancer panel found no pathogenic mutations.   The Common Hereditary Cancers Panel offered by [facility] includes sequencing and/or deletion duplication testing of the following 48 genes: APC, ATM, AXIN2, BARD1, BMPR1A, BRCA1, BRCA2, BRIP1, CDH1, CDKN2A (p14ARF), CDKN2A (p16INK4a), CKD4, CHEK2, CTNNA1, DICER1, EPCAM (Deletion/duplication testing only), GREM1 (promoter region deletion/duplication testing only), KIT, MEN1, MLH1, MSH2, MSH3, MSH6, MUTYH, NBN, NF1, NHTL1, PALB2, PDGFRA, PMS2, POLD1, POLE, PTEN, RAD50, RAD51C, RAD51D, RNF43, SDHB, SDHC, SDHD, SMAD4, SMARCA4. STK11, TP53, TSC1, TSC2, and VHL.  The following genes were evaluated for sequence changes only: SDHA and HOXB13 c.251G>A variant only.   The test report has been scanned into EPIC and is located under the Molecular Pathology section of the Results Review tab.  A portion of the result report is included below for reference.     We discussed with [name] that because current genetic testing is not perfect, it is possible there may be a gene mutation in one of these genes that current testing cannot detect, but that chance is small.  We also  discussed, that there could be another gene that has not yet been discovered, or that we have not yet tested, that is responsible for the cancer diagnoses in the family. It is also possible there is a hereditary cause for the cancer in the family that [name] did not inherit and therefore was not identified in her testing.  Therefore, it is important to remain in touch with cancer genetics in the future so that we can continue to offer [name] the most up to date genetic testing.   ADDITIONAL GENETIC TESTING: We discussed with [name] that her genetic testing was fairly extensive.  If there are genes identified to increase cancer risk that can be analyzed in the future, we would be happy to discuss and coordinate this testing at that time.    CANCER SCREENING RECOMMENDATIONS: [name] test result is considered negative (normal).  This means that we have not identified a hereditary cause for her family history of cancer at this time.   While reassuring, this does not definitively rule out a hereditary predisposition to cancer. It is still possible that there could be genetic mutations that are undetectable by current technology. There could be genetic mutations in genes that have not been tested or identified to increase cancer risk.  Therefore, it is recommended she continue to follow the cancer management and screening guidelines provided by her  primary healthcare provider.   An individual's cancer risk and medical management are not determined by genetic test results alone. Overall cancer risk assessment incorporates additional factors, including personal medical history, family history, and any available genetic information that may result in a personalized plan for cancer prevention and surveillance.   Based on [name]'s personal and family history of cancer as well as her genetic test results, risk model [name] was used to estimate her risk of developing breast cancer. This  estimates her lifetime risk of developing breast cancer to be approximately 29.3%.  The patient's lifetime breast cancer risk is a preliminary estimate based on available information using one of several models endorsed by the [employer] (ACS). The ACS recommends consideration of breast MRI screening as an adjunct to mammography for patients at high risk (defined as 20% or greater lifetime risk).     [name] has been determined to be at high risk for breast cancer.  Therefore, we recommend that annual screening with mammography and breast MRI. We discussed that [name] should discuss her individual situation with her referring physician and determine a breast cancer screening plan with which they are both comfortable.  [name] was also offered a referral to our high risk breast clinic to discuss her options but preferred to talk with her referring provider first. If [name] would like a referral to our high risk breast clinic we can help coordinate this at any time.   RECOMMENDATIONS FOR FAMILY MEMBERS:  Relatives in this family might be  at some increased risk of developing cancer, over the general population risk, simply due to the family history of cancer.  We recommended female relatives in this family have a yearly mammogram beginning at age 42, or 24 years younger than the earliest onset of cancer, an annual clinical breast exam, and perform monthly breast self-exams. Female relatives in this family should also have a gynecological exam as recommended by their primary provider.  All family members should be referred for colonoscopy starting at age 43, or as directed by their physician.   It is also possible there is a hereditary cause for the cancer in [name]'s family that she did not inherit and therefore was not identified in her.  Based on [name]'s family history, we recommended her siblings and maternal relatives, especially those who have had cancer,  have genetic  counseling and testing. Ms. Wilner will let us know if we can be of any assistance in coordinating genetic counseling and/or testing for these family members.  FOLLOW-UP: Lastly, we discussed with Ms. Denunzio that cancer genetics is a rapidly advancing field and it is possible that new genetic tests will be appropriate for her and/or her family members in the future. We encouraged her to remain in contact with cancer genetics on an annual basis so we can update her personal and family histories and let her know of advances in cancer genetics that may benefit this family.   Our contact number was provided. Ms. Shingledecker questions were answered to hersatisfaction, and she knows she is welcome to call us at anytime with additional questions or concerns.   Faith Rogue, MS, [facility] Genetic Counselor [email or web address] Phone: [phone]

## 2019-12-06 DIAGNOSIS — M542 Cervicalgia: Secondary | ICD-10-CM | POA: Diagnosis not present

## 2019-12-06 DIAGNOSIS — R6884 Jaw pain: Secondary | ICD-10-CM | POA: Diagnosis not present

## 2019-12-06 DIAGNOSIS — G43909 Migraine, unspecified, not intractable, without status migrainosus: Secondary | ICD-10-CM | POA: Diagnosis not present

## 2019-12-17 DIAGNOSIS — R591 Generalized enlarged lymph nodes: Secondary | ICD-10-CM | POA: Diagnosis not present

## 2019-12-17 DIAGNOSIS — I10 Essential (primary) hypertension: Secondary | ICD-10-CM | POA: Insufficient documentation

## 2019-12-17 DIAGNOSIS — J4520 Mild intermittent asthma, uncomplicated: Secondary | ICD-10-CM | POA: Diagnosis not present

## 2019-12-17 DIAGNOSIS — Z9109 Other allergy status, other than to drugs and biological substances: Secondary | ICD-10-CM | POA: Insufficient documentation

## 2020-02-14 DIAGNOSIS — G43909 Migraine, unspecified, not intractable, without status migrainosus: Secondary | ICD-10-CM | POA: Diagnosis not present

## 2020-02-14 DIAGNOSIS — R6884 Jaw pain: Secondary | ICD-10-CM | POA: Diagnosis not present

## 2020-02-14 DIAGNOSIS — M545 Low back pain: Secondary | ICD-10-CM | POA: Diagnosis not present

## 2020-02-14 DIAGNOSIS — M542 Cervicalgia: Secondary | ICD-10-CM | POA: Diagnosis not present

## 2020-03-02 DIAGNOSIS — J4520 Mild intermittent asthma, uncomplicated: Secondary | ICD-10-CM | POA: Diagnosis not present

## 2020-03-02 DIAGNOSIS — I10 Essential (primary) hypertension: Secondary | ICD-10-CM | POA: Diagnosis not present

## 2020-03-02 DIAGNOSIS — J0100 Acute maxillary sinusitis, unspecified: Secondary | ICD-10-CM | POA: Diagnosis not present

## 2020-03-06 DIAGNOSIS — M542 Cervicalgia: Secondary | ICD-10-CM | POA: Diagnosis not present

## 2020-03-06 DIAGNOSIS — R6884 Jaw pain: Secondary | ICD-10-CM | POA: Diagnosis not present

## 2020-03-06 DIAGNOSIS — G43909 Migraine, unspecified, not intractable, without status migrainosus: Secondary | ICD-10-CM | POA: Diagnosis not present

## 2020-03-06 DIAGNOSIS — M545 Low back pain: Secondary | ICD-10-CM | POA: Diagnosis not present

## 2020-03-13 DIAGNOSIS — Z566 Other physical and mental strain related to work: Secondary | ICD-10-CM | POA: Diagnosis not present

## 2020-03-13 DIAGNOSIS — F4321 Adjustment disorder with depressed mood: Secondary | ICD-10-CM | POA: Diagnosis not present

## 2020-03-13 DIAGNOSIS — I10 Essential (primary) hypertension: Secondary | ICD-10-CM | POA: Diagnosis not present

## 2020-03-27 DIAGNOSIS — R6884 Jaw pain: Secondary | ICD-10-CM | POA: Diagnosis not present

## 2020-03-27 DIAGNOSIS — G43909 Migraine, unspecified, not intractable, without status migrainosus: Secondary | ICD-10-CM | POA: Diagnosis not present

## 2020-03-27 DIAGNOSIS — M542 Cervicalgia: Secondary | ICD-10-CM | POA: Diagnosis not present

## 2020-03-27 DIAGNOSIS — M545 Low back pain, unspecified: Secondary | ICD-10-CM | POA: Diagnosis not present

## 2020-03-31 DIAGNOSIS — L309 Dermatitis, unspecified: Secondary | ICD-10-CM | POA: Diagnosis not present

## 2020-03-31 DIAGNOSIS — L820 Inflamed seborrheic keratosis: Secondary | ICD-10-CM | POA: Diagnosis not present

## 2020-03-31 DIAGNOSIS — L810 Postinflammatory hyperpigmentation: Secondary | ICD-10-CM | POA: Diagnosis not present

## 2020-03-31 DIAGNOSIS — L818 Other specified disorders of pigmentation: Secondary | ICD-10-CM | POA: Diagnosis not present

## 2020-04-10 DIAGNOSIS — M542 Cervicalgia: Secondary | ICD-10-CM | POA: Diagnosis not present

## 2020-04-10 DIAGNOSIS — R6884 Jaw pain: Secondary | ICD-10-CM | POA: Diagnosis not present

## 2020-04-10 DIAGNOSIS — G43909 Migraine, unspecified, not intractable, without status migrainosus: Secondary | ICD-10-CM | POA: Diagnosis not present

## 2020-04-10 DIAGNOSIS — M545 Low back pain, unspecified: Secondary | ICD-10-CM | POA: Diagnosis not present

## 2020-04-13 DIAGNOSIS — F411 Generalized anxiety disorder: Secondary | ICD-10-CM | POA: Diagnosis not present

## 2020-04-23 DIAGNOSIS — I10 Essential (primary) hypertension: Secondary | ICD-10-CM | POA: Diagnosis not present

## 2020-04-23 DIAGNOSIS — N898 Other specified noninflammatory disorders of vagina: Secondary | ICD-10-CM | POA: Diagnosis not present

## 2020-04-23 DIAGNOSIS — L989 Disorder of the skin and subcutaneous tissue, unspecified: Secondary | ICD-10-CM | POA: Diagnosis not present

## 2020-05-02 DIAGNOSIS — F411 Generalized anxiety disorder: Secondary | ICD-10-CM | POA: Diagnosis not present

## 2020-05-08 DIAGNOSIS — M545 Low back pain, unspecified: Secondary | ICD-10-CM | POA: Diagnosis not present

## 2020-05-08 DIAGNOSIS — G43909 Migraine, unspecified, not intractable, without status migrainosus: Secondary | ICD-10-CM | POA: Diagnosis not present

## 2020-05-08 DIAGNOSIS — R6884 Jaw pain: Secondary | ICD-10-CM | POA: Diagnosis not present

## 2020-05-08 DIAGNOSIS — M542 Cervicalgia: Secondary | ICD-10-CM | POA: Diagnosis not present

## 2020-05-18 DIAGNOSIS — F411 Generalized anxiety disorder: Secondary | ICD-10-CM | POA: Diagnosis not present

## 2020-05-22 DIAGNOSIS — G43909 Migraine, unspecified, not intractable, without status migrainosus: Secondary | ICD-10-CM | POA: Diagnosis not present

## 2020-05-22 DIAGNOSIS — M545 Low back pain, unspecified: Secondary | ICD-10-CM | POA: Diagnosis not present

## 2020-05-22 DIAGNOSIS — M542 Cervicalgia: Secondary | ICD-10-CM | POA: Diagnosis not present

## 2020-05-22 DIAGNOSIS — R6884 Jaw pain: Secondary | ICD-10-CM | POA: Diagnosis not present

## 2020-06-01 DIAGNOSIS — F411 Generalized anxiety disorder: Secondary | ICD-10-CM | POA: Diagnosis not present

## 2020-06-05 DIAGNOSIS — M542 Cervicalgia: Secondary | ICD-10-CM | POA: Diagnosis not present

## 2020-06-05 DIAGNOSIS — R6884 Jaw pain: Secondary | ICD-10-CM | POA: Diagnosis not present

## 2020-06-05 DIAGNOSIS — G43909 Migraine, unspecified, not intractable, without status migrainosus: Secondary | ICD-10-CM | POA: Diagnosis not present

## 2020-06-05 DIAGNOSIS — M545 Low back pain, unspecified: Secondary | ICD-10-CM | POA: Diagnosis not present

## 2020-07-22 ENCOUNTER — Ambulatory Visit: Payer: Federal, State, Local not specified - PPO | Admitting: Podiatry

## 2020-07-24 ENCOUNTER — Encounter: Payer: Self-pay | Admitting: Podiatry

## 2020-07-24 ENCOUNTER — Encounter: Payer: Self-pay | Admitting: *Deleted

## 2020-07-24 ENCOUNTER — Ambulatory Visit: Payer: Federal, State, Local not specified - PPO | Admitting: Podiatry

## 2020-07-24 ENCOUNTER — Other Ambulatory Visit: Payer: Self-pay

## 2020-07-24 DIAGNOSIS — Z79899 Other long term (current) drug therapy: Secondary | ICD-10-CM

## 2020-07-24 DIAGNOSIS — M722 Plantar fascial fibromatosis: Secondary | ICD-10-CM | POA: Diagnosis not present

## 2020-07-24 DIAGNOSIS — B351 Tinea unguium: Secondary | ICD-10-CM

## 2020-07-24 MED ORDER — MELOXICAM 15 MG PO TABS: 15.0000 mg | ORAL_TABLET | Freq: Every day | ORAL | 0 refills | Status: AC

## 2020-07-24 MED ORDER — TERBINAFINE HCL 250 MG PO TABS: 250.0000 mg | ORAL_TABLET | Freq: Every day | ORAL | 0 refills | Status: DC

## 2020-07-24 NOTE — Progress Notes (Signed)
   Subjective: 55 y.o. female presenting today as a new patient for evaluation of a dystrophic discolored nail to the left hallux nail plate.  Patient has been diagnosed in the past with onychomycosis of the toenail however she did not take a full course of antifungal medication treatment.  She presents to have it evaluated and discussed treatment options  Patient also has a longstanding history of plantar fasciitis to the bilateral feet.  She states that it is only intermittently painful depending on the shoes that she wears.  She has been prescribed orthotics in the past with minimal improvement.  She presents for further treatment and evaluation  Past Medical History:  Diagnosis Date  . Allergy   . Anemia   . Depression   . Family history of breast cancer   . Family history of ovarian cancer   . Family history of throat cancer   . Fibroids   . Frequent headaches   . Thyroid disease     Objective: Physical Exam General: The patient is alert and oriented x3 in no acute distress.  Dermatology: Hyperkeratotic, discolored, thickened, onychodystrophy noted left hallux nail plate. Skin is warm, dry and supple bilateral lower extremities. Negative for open lesions or macerations.  Vascular: Palpable pedal pulses bilaterally. No edema or erythema noted. Capillary refill within normal limits.  Neurological: Epicritic and protective threshold grossly intact bilaterally.   Musculoskeletal Exam: Range of motion within normal limits to all pedal and ankle joints bilateral. Muscle strength 5/5 in all groups bilateral.  There is also some tenderness to palpation on the medial calcaneal tubercle bilateral heels  Assessment: #1 Onychomycosis of toenail left hallux nail plate #2  Plantar fasciitis bilateral  Plan of Care:  #1 Patient was evaluated. #2  Prescription for Lamisil turned 50 mg #90 daily #3 order placed for hepatic function panel.  If abnormal discontinue the oral Lamisil #4  recommend OTC urea topical nail gel available on Amazon #5 prescription for meloxicam 15 mg daily as needed heel pain #6 return to clinic as needed   Edrick Kins, DPM [facility]  Dr. Edrick Kins, DPM    [address], [place] [address]                Office [phone]  Fax [phone]

## 2020-07-25 LAB — HEPATIC FUNCTION PANEL
ALT: 17 IU/L (ref 0–32)
AST: 15 IU/L (ref 0–40)
Albumin: 4.3 g/dL (ref 3.8–4.9)
Alkaline Phosphatase: 89 IU/L (ref 44–121)
Bilirubin Total: 0.4 mg/dL (ref 0.0–1.2)
Bilirubin, Direct: 0.1 mg/dL (ref 0.00–0.40)
Total Protein: 8.3 g/dL (ref 6.0–8.5)

## 2020-11-11 ENCOUNTER — Other Ambulatory Visit: Payer: Self-pay | Admitting: Podiatry

## 2020-11-11 DIAGNOSIS — B351 Tinea unguium: Secondary | ICD-10-CM

## 2020-11-18 ENCOUNTER — Telehealth: Payer: Self-pay | Admitting: *Deleted

## 2020-11-18 NOTE — Telephone Encounter (Signed)
"  I need a refill on my medicine.  It's working really well.  The pharmacy said they had reached out several times but they have not heard anything.  I don't want the medicine to get out of my system."  I will send a message to he and his nurse.  The medication will stay in your system for a while, no need to worry.

## 2020-11-20 NOTE — Telephone Encounter (Signed)
Pt called back regarding refill request. Advised pt that Dr Amalia Hailey left right after clinic to go into surgery but I would forward to the provider on call.

## 2020-11-20 NOTE — Telephone Encounter (Signed)
I spoke with patient and informed her that we are still waiting on a response from Dr. Amalia Hailey and the the Lamisil stays in her system for a while.  I informed her that I will let her know if he refills it or wants her to come back in for recheck

## 2020-11-24 ENCOUNTER — Other Ambulatory Visit: Payer: Self-pay

## 2020-11-24 DIAGNOSIS — Z79899 Other long term (current) drug therapy: Secondary | ICD-10-CM

## 2020-11-25 NOTE — Telephone Encounter (Signed)
Patient needs new hepatic function panel. If normal, please re-prescribe Lamisil 250mg  #90. - Dr. Amalia Hailey

## 2021-02-05 LAB — HEPATIC FUNCTION PANEL
ALT: 15 IU/L (ref 0–32)
AST: 15 IU/L (ref 0–40)
Albumin: 4.1 g/dL (ref 3.8–4.9)
Alkaline Phosphatase: 85 IU/L (ref 44–121)
Bilirubin Total: 0.3 mg/dL (ref 0.0–1.2)
Bilirubin, Direct: 0.12 mg/dL (ref 0.00–0.40)
Total Protein: 7.5 g/dL (ref 6.0–8.5)

## 2021-02-09 ENCOUNTER — Other Ambulatory Visit: Payer: Self-pay | Admitting: Podiatry

## 2021-02-09 DIAGNOSIS — B351 Tinea unguium: Secondary | ICD-10-CM

## 2021-02-09 MED ORDER — TERBINAFINE HCL 250 MG PO TABS: 250.0000 mg | ORAL_TABLET | Freq: Every day | ORAL | 0 refills | Status: AC

## 2021-03-31 ENCOUNTER — Other Ambulatory Visit: Payer: Self-pay | Admitting: Physician Assistant

## 2021-03-31 DIAGNOSIS — Z1231 Encounter for screening mammogram for malignant neoplasm of breast: Secondary | ICD-10-CM

## 2021-04-27 ENCOUNTER — Other Ambulatory Visit: Payer: Self-pay

## 2021-04-27 ENCOUNTER — Ambulatory Visit
Admission: RE | Admit: 2021-04-27 | Discharge: 2021-04-27 | Disposition: A | Discharge status: Home / Self Care | Payer: Federal, State, Local not specified - PPO | Source: Ambulatory Visit | Attending: Physician Assistant | Admitting: Physician Assistant

## 2021-04-27 DIAGNOSIS — Z1231 Encounter for screening mammogram for malignant neoplasm of breast: Secondary | ICD-10-CM

## 2022-03-03 IMAGING — MG MM DIGITAL SCREENING BILAT W/ TOMO AND CAD
8 series · 8 of 24 positions shown · non-contrast
Comparison: Previous exam(s).

CLINICAL DATA: Screening.

EXAM:
DIGITAL SCREENING BILATERAL MAMMOGRAM WITH TOMOSYNTHESIS AND CAD
TECHNIQUE: Bilateral screening digital craniocaudal and mediolateral oblique
mammograms were obtained. Bilateral screening digital breast
tomosynthesis was performed. The images were evaluated with
computer-aided detection.

[L CC synth-2D]
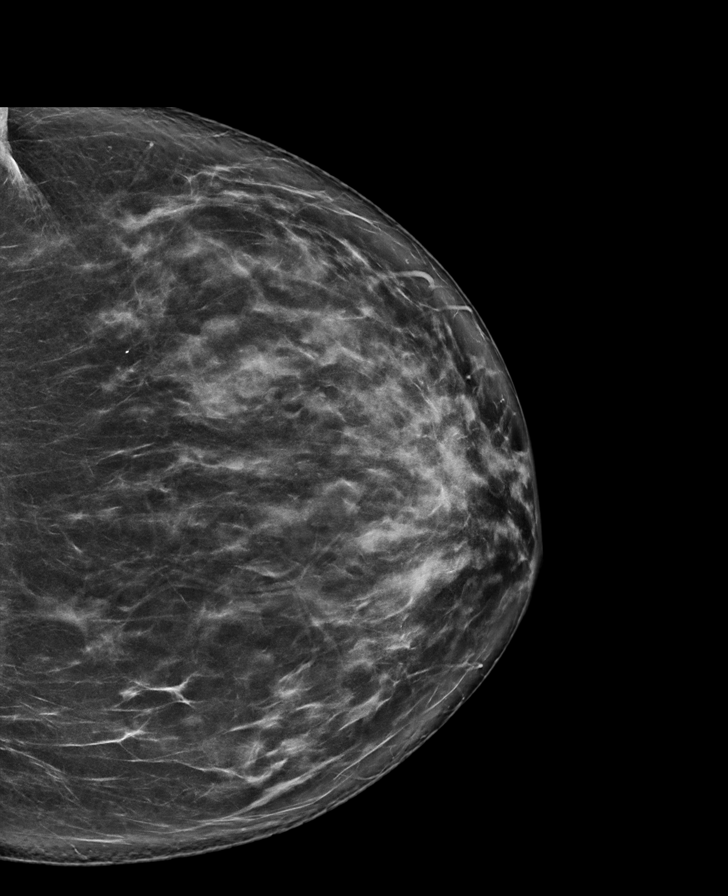

[R CC synth-2D]
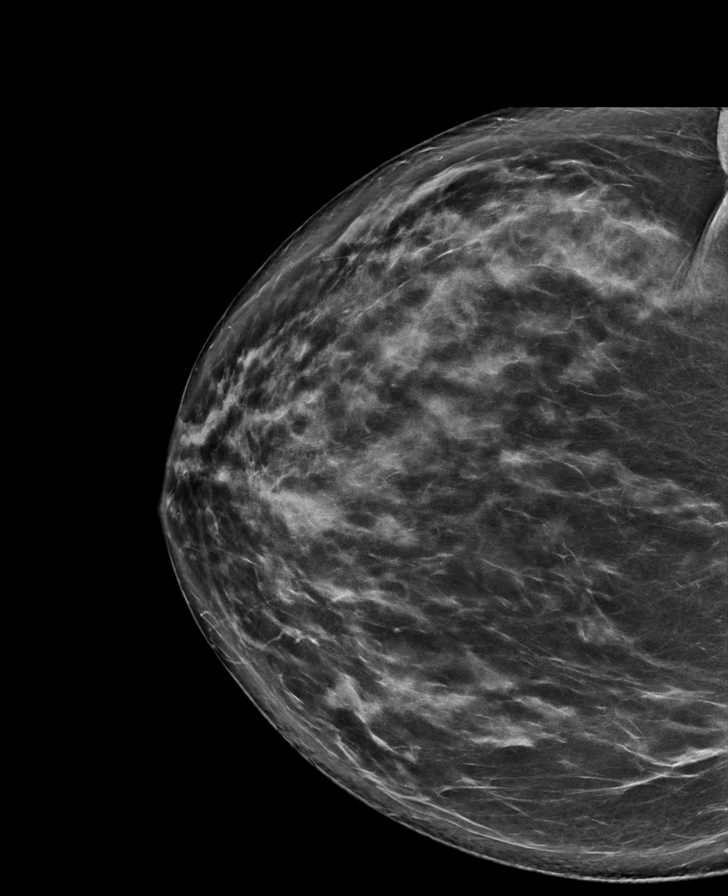

[R MLO synth-2D]
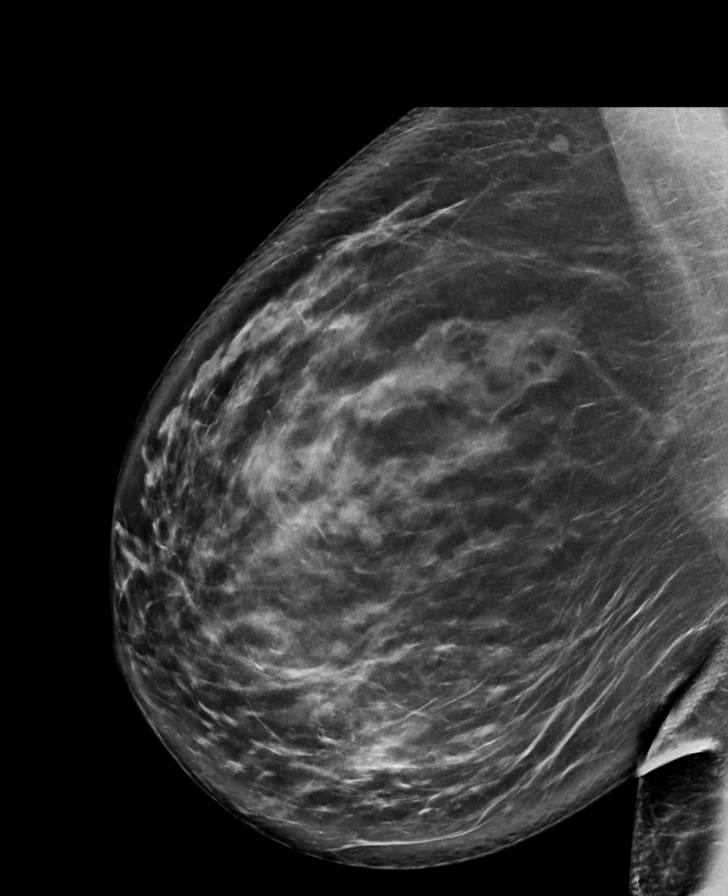

[L MLO synth-2D]
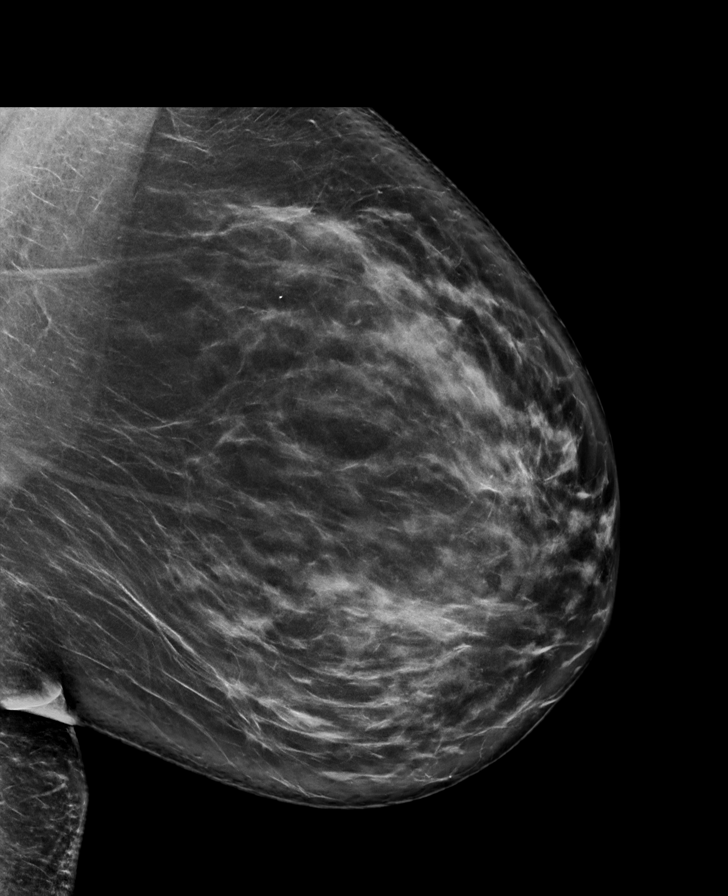

[L CC tomo · tomo slice 47/93.0]
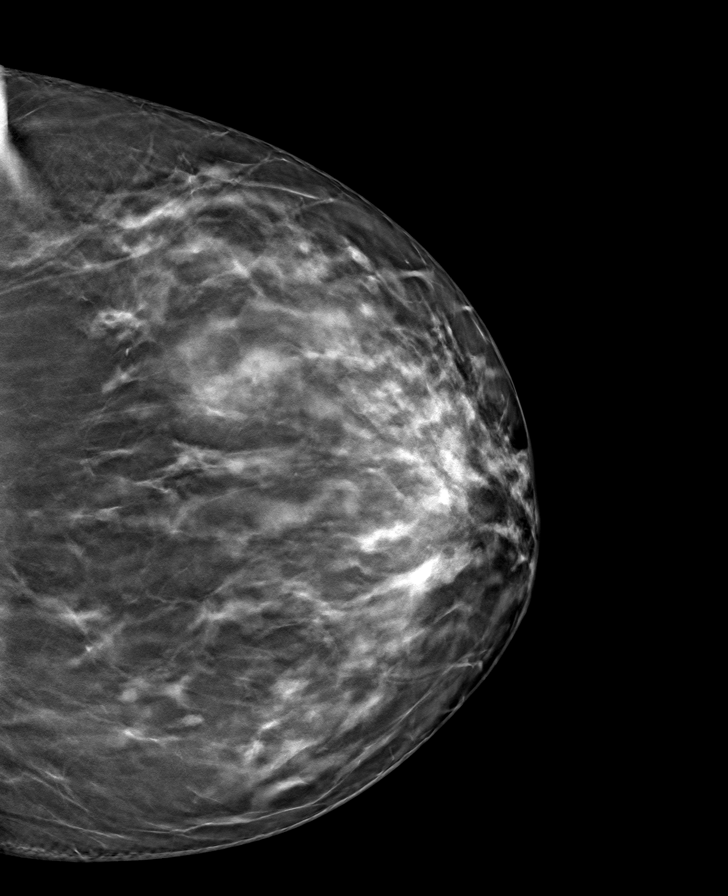

[R CC tomo · tomo slice 50/99.0]
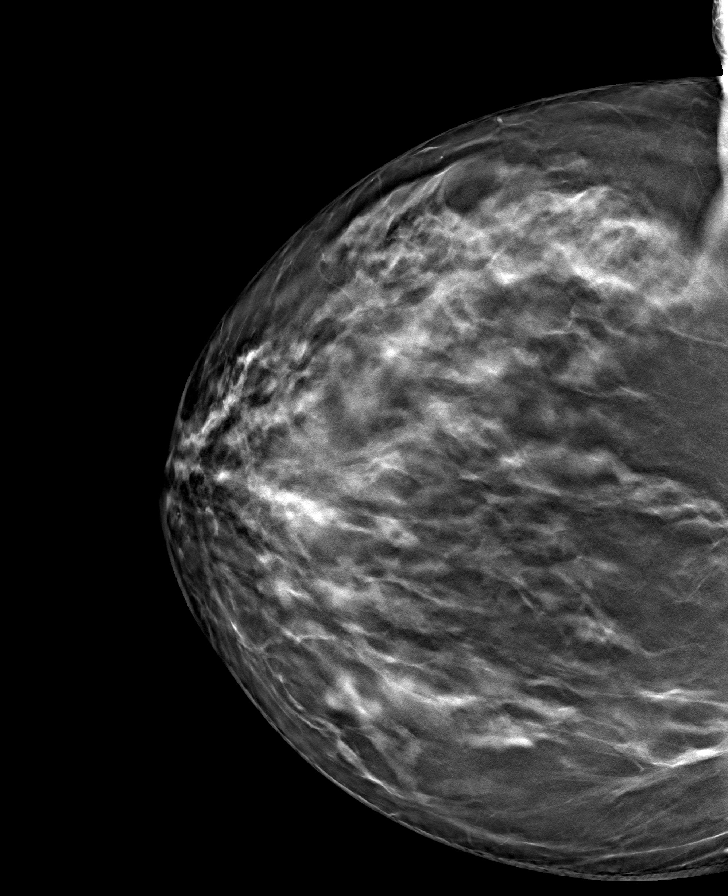

[R MLO tomo · tomo slice 54/107.0]
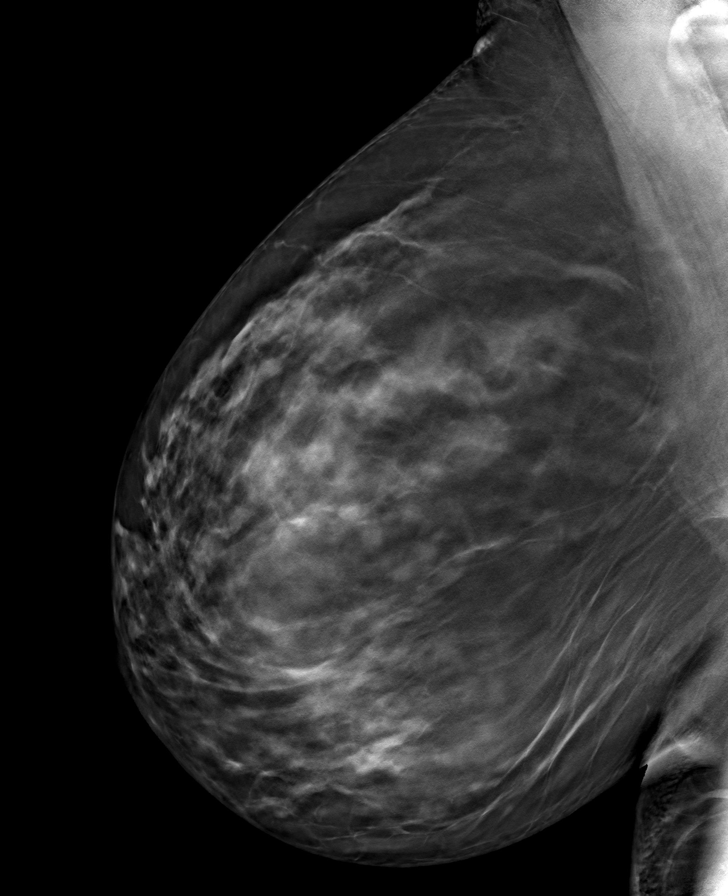

[L MLO tomo · tomo slice 51/102.0]
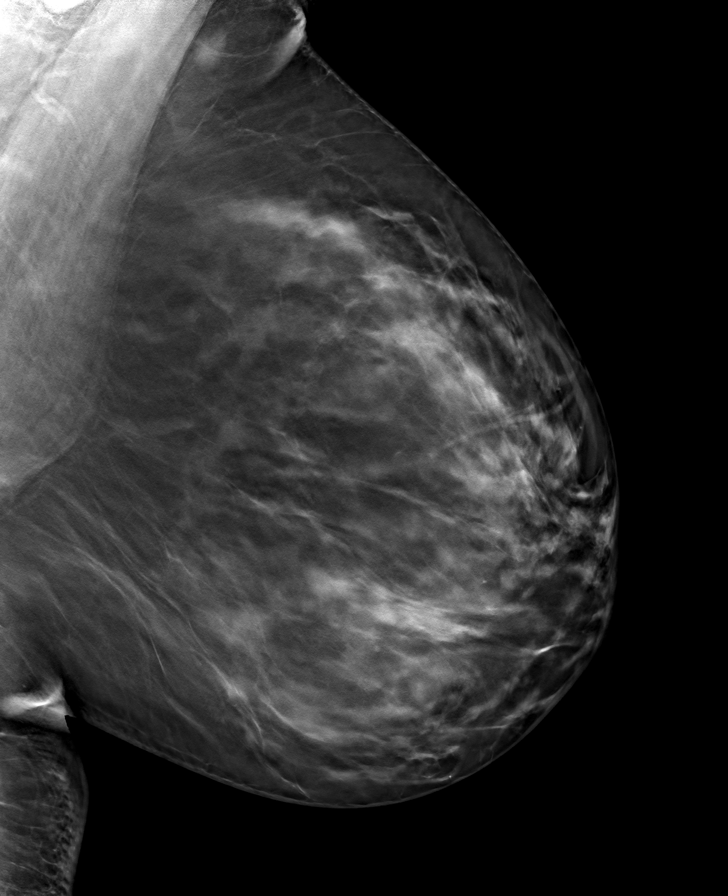

[8 of 24 positions shown; findings below may reference images not displayed]

ACR Breast Density Category c: The breast tissue is heterogeneously
dense, which may obscure small masses.
FINDINGS: There are no findings suspicious for malignancy.
IMPRESSION: No mammographic evidence of malignancy. A result letter of this
screening mammogram will be mailed directly to the patient.

RECOMMENDATION:
Screening mammogram in one year. (Code:Q3-W-BC3)

BI-RADS CATEGORY  1: Negative.

## 2022-07-25 ENCOUNTER — Emergency Department (HOSPITAL_BASED_OUTPATIENT_CLINIC_OR_DEPARTMENT_OTHER): Payer: Federal, State, Local not specified - PPO

## 2022-07-25 ENCOUNTER — Emergency Department (HOSPITAL_BASED_OUTPATIENT_CLINIC_OR_DEPARTMENT_OTHER)
Admission: EM | Admit: 2022-07-25 | Discharge: 2022-07-26 | Disposition: A | Discharge status: Home / Self Care | Payer: Federal, State, Local not specified - PPO | Attending: Emergency Medicine | Admitting: Emergency Medicine

## 2022-07-25 ENCOUNTER — Encounter (HOSPITAL_BASED_OUTPATIENT_CLINIC_OR_DEPARTMENT_OTHER): Payer: Self-pay | Admitting: Emergency Medicine

## 2022-07-25 ENCOUNTER — Other Ambulatory Visit: Payer: Self-pay

## 2022-07-25 ENCOUNTER — Emergency Department (HOSPITAL_COMMUNITY): Payer: Federal, State, Local not specified - PPO

## 2022-07-25 DIAGNOSIS — I639 Cerebral infarction, unspecified: Secondary | ICD-10-CM | POA: Insufficient documentation

## 2022-07-25 DIAGNOSIS — R079 Chest pain, unspecified: Secondary | ICD-10-CM | POA: Insufficient documentation

## 2022-07-25 DIAGNOSIS — R209 Unspecified disturbances of skin sensation: Secondary | ICD-10-CM | POA: Insufficient documentation

## 2022-07-25 DIAGNOSIS — R0789 Other chest pain: Secondary | ICD-10-CM

## 2022-07-25 DIAGNOSIS — R200 Anesthesia of skin: Secondary | ICD-10-CM

## 2022-07-25 DIAGNOSIS — R7309 Other abnormal glucose: Secondary | ICD-10-CM | POA: Diagnosis not present

## 2022-07-25 DIAGNOSIS — Z79899 Other long term (current) drug therapy: Secondary | ICD-10-CM | POA: Diagnosis not present

## 2022-07-25 LAB — CBC
HCT: 41.2 % (ref 36.0–46.0)
Hemoglobin: 13.8 g/dL (ref 12.0–15.0)
MCH: 28.9 pg (ref 26.0–34.0)
MCHC: 33.5 g/dL (ref 30.0–36.0)
MCV: 86.2 fL (ref 80.0–100.0)
Platelets: 350 10*3/uL (ref 150–400)
RBC: 4.78 MIL/uL (ref 3.87–5.11)
RDW: 13.7 % (ref 11.5–15.5)
WBC: 6.8 10*3/uL (ref 4.0–10.5)
nRBC: 0 % (ref 0.0–0.2)

## 2022-07-25 LAB — I-STAT CREATININE, ED: Creatinine, Ser: 0.9 mg/dL (ref 0.44–1.00)

## 2022-07-25 LAB — BASIC METABOLIC PANEL
Anion gap: 9 (ref 5–15)
BUN: 15 mg/dL (ref 6–20)
CO2: 27 mmol/L (ref 22–32)
Calcium: 10 mg/dL (ref 8.9–10.3)
Chloride: 101 mmol/L (ref 98–111)
Creatinine, Ser: 0.8 mg/dL (ref 0.44–1.00)
GFR, Estimated: >60 mL/min (ref >60)
Glucose, Bld: 113 mg/dL — ABNORMAL HIGH (ref 70–99)
Potassium: 3.5 mmol/L (ref 3.5–5.1)
Sodium: 137 mmol/L (ref 135–145)

## 2022-07-25 LAB — TROPONIN I (HIGH SENSITIVITY)
Troponin I (High Sensitivity): <2 ng/L (ref <18)
Troponin I (High Sensitivity): <2 ng/L (ref <18)

## 2022-07-25 MED ORDER — LORAZEPAM 1 MG PO TABS: 0.5000 mg | ORAL_TABLET | ORAL | Status: DC | PRN

## 2022-07-25 MED ORDER — LORAZEPAM 1 MG PO TABS
1.0000 mg | ORAL_TABLET | ORAL | Status: DC | PRN
  Administered 2022-07-25 – 2022-07-26 (×3): 1 mg via ORAL
  Filled 2022-07-25 (×3): qty 1

## 2022-07-25 MED ORDER — IOHEXOL 350 MG/ML SOLN
100.0000 mL | Freq: Once | INTRAVENOUS | Status: AC | PRN
  Administered 2022-07-25: 100 mL via INTRAVENOUS

## 2022-07-25 NOTE — ED Provider Notes (Signed)
[facility] Provider Note   CSN: [id number] Arrival date & time: 07/25/22  1524     History {Add pertinent medical, surgical, social history, OB history to HPI:1} Chief Complaint  Patient presents with   Chest Pain    [name] is a 57 y.o. female.  57 year old female presents in transfer from Mr. Wind[facility] due to need for MRI of her brain and cervical spine.  Patient has had intermittent paresthesias of her left arm for some time but worse over the last several days.  States it has been constant in nature.  Was at [facility] and had a cardiac workup as she been having some intermittent chest pain.  That visit was reviewed and was appropriate.  Patient denies any bowel or bladder dysfunction.  No weakness.  No ataxia.  Has not been evaluated for this in the past       Home Medications Prior to Admission medications   Medication Sig Start Date End Date Taking? Authorizing Provider  azelastine (ASTELIN) 0.1 % nasal spray Place 2 sprays into both nostrils 2 (two) times daily. Use in each nostril as directed Patient not taking: Reported on 08/23/2018 02/02/18   Gale Journey, Damaris Hippo, PA-C  fluconazole (DIFLUCAN) 150 MG tablet Take 1 tablet once for yeast infection; may repeat once in 5 days if needed 03/16/20   [provider]  levalbuterol Penne Lash HFA) 45 MCG/ACT inhaler Inhale into the lungs. 11/19/19   [provider]  meloxicam (MOBIC) 15 MG tablet Take 1 tablet (15 mg total) by mouth daily. 07/24/20   Edrick Kins, DPM  naproxen (NAPROSYN) 500 MG tablet Take 1 tablet (500 mg total) by mouth 2 (two) times daily with a meal. 06/14/18   Forrest Moron, MD  SUMAtriptan (IMITREX) 50 MG tablet Take 1 tablet (50 mg total) by mouth every 2 (two) hours as needed for migraine. May repeat in 2 hours if headache persists or recurs. 06/14/18   Forrest Moron, MD  SYNTHROID 50 MCG tablet TAKE 1 TABLET BY MOUTH ONCE DAILY BEFORE  BREAKFAST 06/14/18   Delia Chimes A, MD  terbinafine (LAMISIL) 250 MG tablet Take 1 tablet (250 mg total) by mouth daily. 02/09/21   Edrick Kins, DPM  triamcinolone (NASACORT) 55 MCG/ACT AERO nasal inhaler Place into the nose. 12/17/19   [provider]  valACYclovir (VALTREX) 500 MG tablet Take by mouth. 02/21/20   [provider]  valsartan (DIOVAN) 160 MG tablet Take 1 tablet by mouth daily. 04/02/20   [provider]      Allergies    Codeine and Singulair [montelukast sodium]    Review of Systems   Review of Systems  All other systems reviewed and are negative.   Physical Exam Updated Vital Signs BP (!) 148/97 (BP Location: Left Arm)   Pulse 70   Temp 98.2 F (36.8 C) (Oral)   Resp 18   Ht 1.651 m ('5\' 5"'$ )   Wt 96.6 kg   LMP 10/06/2017   SpO2 98%   BMI 35.45 kg/m  Physical Exam Vitals and nursing note reviewed.  Constitutional:      General: She is not in acute distress.    Appearance: Normal appearance. She is well-developed. She is not toxic-appearing.  HENT:     Head: Normocephalic and atraumatic.  Eyes:     General: Lids are normal.     Conjunctiva/sclera: Conjunctivae normal.     Pupils: Pupils are equal, round,  and reactive to light.  Neck:     Thyroid: No thyroid mass.     Trachea: No tracheal deviation.  Cardiovascular:     Rate and Rhythm: Normal rate and regular rhythm.     Heart sounds: Normal heart sounds. No murmur heard.    No gallop.  Pulmonary:     Effort: Pulmonary effort is normal. No respiratory distress.     Breath sounds: Normal breath sounds. No stridor. No decreased breath sounds, wheezing, rhonchi or rales.  Abdominal:     General: There is no distension.     Palpations: Abdomen is soft.     Tenderness: There is no abdominal tenderness. There is no rebound.  Musculoskeletal:        General: No tenderness. Normal range of motion.     Cervical back: Normal range of motion and neck supple.  Skin:    General:  Skin is warm and dry.     Findings: No abrasion or rash.  Neurological:     General: No focal deficit present.     Mental Status: She is alert and oriented to person, place, and time. Mental status is at baseline.     GCS: GCS eye subscore is 4. GCS verbal subscore is 5. GCS motor subscore is 6.     Cranial Nerves: No cranial nerve deficit.     Sensory: No sensory deficit.     Motor: Motor function is intact.  Psychiatric:        Attention and Perception: Attention normal.        Speech: Speech normal.        Behavior: Behavior normal.     ED Results / Procedures / Treatments   Labs (all labs ordered are listed, but only abnormal results are displayed) Labs Reviewed  BASIC METABOLIC PANEL - Abnormal; Notable for the following components:      Result Value   Glucose, Bld 113 (*)    All other components within normal limits  RESP PANEL BY RT-PCR (RSV, FLU A&B, COVID)  RVPGX2  CBC  I-STAT CREATININE, ED  TROPONIN I (HIGH SENSITIVITY)  TROPONIN I (HIGH SENSITIVITY)    EKG EKG Interpretation  Date/Time:  Monday July 25 2022 15:32:36 EST Ventricular Rate:  76 PR Interval:  174 QRS Duration: 72 QT Interval:  364 QTC Calculation: 409 R Axis:   7 Text Interpretation: Normal sinus rhythm Cannot rule out Anterior infarct , age undetermined Abnormal ECG No previous ECGs available Confirmed by Regan Lemming ([id number]) on 07/25/2022 3:57:17 PM  Radiology CT Angio Chest/Abd/Pel for Dissection W and/or Wo Contrast  Result Date: 07/25/2022 CLINICAL DATA:  Chest pain. EXAM: CT ANGIOGRAPHY CHEST, ABDOMEN AND PELVIS TECHNIQUE: Non-contrast CT of the chest was initially obtained. Multidetector CT imaging through the chest, abdomen and pelvis was performed using the standard protocol during bolus administration of intravenous contrast. Multiplanar reconstructed images and MIPs were obtained and reviewed to evaluate the vascular anatomy. RADIATION DOSE REDUCTION: This exam was performed according  to the departmental dose-optimization program which includes automated exposure control, adjustment of the mA and/or kV according to patient size and/or use of iterative reconstruction technique. CONTRAST:  134m OMNIPAQUE IOHEXOL 350 MG/ML SOLN COMPARISON:  August 27, 2009. FINDINGS: CTA CHEST FINDINGS Cardiovascular: Preferential opacification of the thoracic aorta. No evidence of thoracic aortic aneurysm or dissection. Normal heart size. No pericardial effusion. Mediastinum/Nodes: No enlarged mediastinal, hilar, or axillary lymph nodes. Thyroid gland, trachea, and esophagus demonstrate no significant findings. Lungs/Pleura: Lungs are clear.  No pleural effusion or pneumothorax. Musculoskeletal: No chest wall abnormality. No acute or significant osseous findings. Review of the MIP images confirms the above findings. CTA ABDOMEN AND PELVIS FINDINGS VASCULAR Aorta: Normal caliber aorta without aneurysm, dissection, vasculitis or significant stenosis. Celiac: Patent without evidence of aneurysm, dissection, vasculitis or significant stenosis. SMA: Patent without evidence of aneurysm, dissection, vasculitis or significant stenosis. Renals: Both renal arteries are patent without evidence of aneurysm, dissection, vasculitis, fibromuscular dysplasia or significant stenosis. IMA: Patent without evidence of aneurysm, dissection, vasculitis or significant stenosis. Inflow: Patent without evidence of aneurysm, dissection, vasculitis or significant stenosis. Veins: No obvious venous abnormality within the limitations of this arterial phase study. Review of the MIP images confirms the above findings. NON-VASCULAR Hepatobiliary: Cholelithiasis is noted without biliary dilatation. Liver is unremarkable. Pancreas: Unremarkable. No pancreatic ductal dilatation or surrounding inflammatory changes. Spleen: Normal in size without focal abnormality. Adrenals/Urinary Tract: Adrenal glands are unremarkable. Kidneys are normal, without  renal calculi, focal lesion, or hydronephrosis. Bladder is unremarkable. Stomach/Bowel: Stomach is within normal limits. Appendix appears normal. No evidence of bowel wall thickening, distention, or inflammatory changes. Lymphatic: No adenopathy is noted. Reproductive: Uterine fibroid is noted. No definite adnexal abnormality is noted. Other: No abdominal wall hernia or abnormality. No abdominopelvic ascites. Musculoskeletal: No acute or significant osseous findings. Review of the MIP images confirms the above findings. IMPRESSION: No evidence of thoracic or abdominal aortic dissection or aneurysm. Cholelithiasis. Uterine fibroid. No other abnormality seen. Electronically Signed   By: Marijo Conception M.D.   On: 07/25/2022 16:39   CT Head Wo Contrast  Result Date: 07/25/2022 CLINICAL DATA:  On and off chest pain last several weeks. Numbness in the right arm over the past 2 days. EXAM: CT HEAD WITHOUT CONTRAST TECHNIQUE: Contiguous axial images were obtained from the base of the skull through the vertex without intravenous contrast. RADIATION DOSE REDUCTION: This exam was performed according to the departmental dose-optimization program which includes automated exposure control, adjustment of the mA and/or kV according to patient size and/or use of iterative reconstruction technique. COMPARISON:  11/04/2009. FINDINGS: Brain: No evidence of acute infarction, hemorrhage, hydrocephalus, extra-axial collection or mass lesion/mass effect. Vascular: No hyperdense vessel or unexpected calcification. Skull: Normal. Negative for fracture or focal lesion. Sinuses/Orbits: Globes and orbits are unremarkable. Visualized sinuses are clear. Other: None. IMPRESSION: Normal unenhanced CT scan of the brain. Electronically Signed   By: Lajean Manes M.D.   On: 07/25/2022 16:30    Procedures Procedures  {Document cardiac monitor, telemetry assessment procedure when appropriate:1}  Medications Ordered in ED Medications   LORazepam (ATIVAN) tablet 0.5 mg (has no administration in time range)  LORazepam (ATIVAN) tablet 1 mg (has no administration in time range)  iohexol (OMNIPAQUE) 350 MG/ML injection 100 mL (100 mLs Intravenous Contrast Given 07/25/22 1609)    ED Course/ Medical Decision Making/ A&P   {   Click here for ABCD2, HEART and other calculatorsREFRESH Note before signing :1}          HEART Score: 3                Medical Decision Making Amount and/or Complexity of Data Reviewed Labs: ordered. Radiology: ordered.  Risk Prescription drug management.   ***  {Document critical care time when appropriate:1} {Document review of labs and clinical decision tools ie heart score, Chads2Vasc2 etc:1}  {Document your independent review of radiology images, and any outside records:1} {Document your discussion with family members, caretakers, and with consultants:1} {Document social  determinants of health affecting pt's care:1} {Document your decision making why or why not admission, treatments were needed:1} Final Clinical Impression(s) / ED Diagnoses Final diagnoses:  Numbness of arm  Chest pain, unspecified type  Musculoskeletal chest pain    Rx / DC Orders ED Discharge Orders     None

## 2022-07-25 NOTE — ED Provider Notes (Addendum)
[facility] Provider Note   CSN: [id number] Arrival date & time: 07/25/22  1524     History  Chief Complaint  Patient presents with   Chest Pain    [name] is a 57 y.o. female.   Chest Pain Associated symptoms: numbness      57 year old female with medical history significant for thyroid disease, anemia, depression who presents to the emergency department with chest pain and left arm numbness.  The patient states that she has had on and off intermittent chest pain in the left side that has been ongoing for the past few weeks.  She describes it as a pulling sensation on the left.  Additionally, she states that she has had left arm numbness that have been intermittent over the last couple of weeks but has been persistent since Friday.  She is very worried about a stroke as her mother was recently diagnosed with a ruptured aneurysm.  She denies any headaches.  She does have a history of migraine headaches and had endorsed headaches over the weekend described as tension type but currently is asymptomatic.  She denies any neck pain.  She denies any radicular symptoms.  She denies any weakness.  She denies any fevers or chills.  She endorses left-sided pressure that radiates to her back that is ongoing.  Home Medications Prior to Admission medications   Medication Sig Start Date End Date Taking? Authorizing Provider  azelastine (ASTELIN) 0.1 % nasal spray Place 2 sprays into both nostrils 2 (two) times daily. Use in each nostril as directed Patient not taking: Reported on 08/23/2018 02/02/18   Gale Journey, Damaris Hippo, PA-C  fluconazole (DIFLUCAN) 150 MG tablet Take 1 tablet once for yeast infection; may repeat once in 5 days if needed 03/16/20   [provider]  levalbuterol Penne Lash HFA) 45 MCG/ACT inhaler Inhale into the lungs. 11/19/19   [provider]  meloxicam (MOBIC) 15 MG tablet Take 1 tablet (15 mg total) by mouth daily.  07/24/20   Edrick Kins, DPM  naproxen (NAPROSYN) 500 MG tablet Take 1 tablet (500 mg total) by mouth 2 (two) times daily with a meal. 06/14/18   Forrest Moron, MD  SUMAtriptan (IMITREX) 50 MG tablet Take 1 tablet (50 mg total) by mouth every 2 (two) hours as needed for migraine. May repeat in 2 hours if headache persists or recurs. 06/14/18   Forrest Moron, MD  SYNTHROID 50 MCG tablet TAKE 1 TABLET BY MOUTH ONCE DAILY BEFORE BREAKFAST 06/14/18   Delia Chimes A, MD  terbinafine (LAMISIL) 250 MG tablet Take 1 tablet (250 mg total) by mouth daily. 02/09/21   Edrick Kins, DPM  triamcinolone (NASACORT) 55 MCG/ACT AERO nasal inhaler Place into the nose. 12/17/19   [provider]  valACYclovir (VALTREX) 500 MG tablet Take by mouth. 02/21/20   [provider]  valsartan (DIOVAN) 160 MG tablet Take 1 tablet by mouth daily. 04/02/20   [provider]      Allergies    Codeine and Singulair [montelukast sodium]    Review of Systems   Review of Systems  Cardiovascular:  Positive for chest pain.  Neurological:  Positive for numbness.  All other systems reviewed and are negative.   Physical Exam Updated Vital Signs BP (!) 156/95 (BP Location: Right Arm)   Pulse 80   Temp 98.1 F (36.7 C) (Oral)   Resp 16   Ht '5\' 5"'$  (1.651 m)   Wt  96.6 kg   LMP 10/06/2017   SpO2 100%   BMI 35.45 kg/m  Physical Exam Vitals and nursing note reviewed.  Constitutional:      General: She is not in acute distress.    Appearance: She is well-developed.  HENT:     Head: Normocephalic and atraumatic.  Eyes:     Conjunctiva/sclera: Conjunctivae normal.  Cardiovascular:     Rate and Rhythm: Normal rate and regular rhythm.     Heart sounds: No murmur heard. Pulmonary:     Effort: Pulmonary effort is normal. No respiratory distress.     Breath sounds: Normal breath sounds.  Chest:     Comments: Left sided MSK chest wall tenderness to palpation, reproduces the patient's  pain Abdominal:     Palpations: Abdomen is soft.     Tenderness: There is no abdominal tenderness.  Musculoskeletal:        General: No swelling.     Cervical back: Neck supple.  Skin:    General: Skin is warm and dry.     Capillary Refill: Capillary refill takes less than 2 seconds.  Neurological:     Mental Status: She is alert.     Comments: MENTAL STATUS EXAM:    Orientation: Alert and oriented to person, place and time.  Memory: Cooperative, follows commands well.  Language: Speech is clear and language is normal.   CRANIAL NERVES:    CN 2 (Optic): Visual fields intact to confrontation.  CN 3,4,6 (EOM): Pupils equal and reactive to light. Full extraocular eye movement without nystagmus.  CN 5 (Trigeminal): Facial sensation is normal, no weakness of masticatory muscles.  CN 7 (Facial): No facial weakness or asymmetry.  CN 8 (Auditory): Auditory acuity grossly normal.  CN 9,10 (Glossophar): The uvula is midline, the palate elevates symmetrically.  CN 11 (spinal access): Normal sternocleidomastoid and trapezius strength.  CN 12 (Hypoglossal): The tongue is midline. No atrophy or fasciculations.Marland Kitchen   MOTOR:  Muscle Strength: 5/5RUE, 5/5LUE, 5/5RLE, 5/5LLE.   COORDINATION:   Intact finger-to-nose, no tremor.   SENSATION:   Intact to light touch all four extremities, DECREASED SENSATION TO LIGHT TOUCH ON THE LEFT SUBJECTIVELY.  GAIT: Gait normal without ataxia   Psychiatric:        Mood and Affect: Mood normal.    ED Results / Procedures / Treatments   Labs (all labs ordered are listed, but only abnormal results are displayed) Labs Reviewed  BASIC METABOLIC PANEL - Abnormal; Notable for the following components:      Result Value   Glucose, Bld 113 (*)    All other components within normal limits  RESP PANEL BY RT-PCR (RSV, FLU A&B, COVID)  RVPGX2  CBC  I-STAT CREATININE, ED  TROPONIN I (HIGH SENSITIVITY)  TROPONIN I (HIGH SENSITIVITY)    EKG EKG  Interpretation  Date/Time:  Monday July 25 2022 15:32:36 EST Ventricular Rate:  76 PR Interval:  174 QRS Duration: 72 QT Interval:  364 QTC Calculation: 409 R Axis:   7 Text Interpretation: Normal sinus rhythm Cannot rule out Anterior infarct , age undetermined Abnormal ECG No previous ECGs available Confirmed by Regan Lemming ([id number]) on 07/25/2022 3:57:17 PM  Radiology CT Angio Chest/Abd/Pel for Dissection W and/or Wo Contrast  Result Date: 07/25/2022 CLINICAL DATA:  Chest pain. EXAM: CT ANGIOGRAPHY CHEST, ABDOMEN AND PELVIS TECHNIQUE: Non-contrast CT of the chest was initially obtained. Multidetector CT imaging through the chest, abdomen and pelvis was performed using the standard protocol during bolus administration of  intravenous contrast. Multiplanar reconstructed images and MIPs were obtained and reviewed to evaluate the vascular anatomy. RADIATION DOSE REDUCTION: This exam was performed according to the departmental dose-optimization program which includes automated exposure control, adjustment of the mA and/or kV according to patient size and/or use of iterative reconstruction technique. CONTRAST:  150m OMNIPAQUE IOHEXOL 350 MG/ML SOLN COMPARISON:  August 27, 2009. FINDINGS: CTA CHEST FINDINGS Cardiovascular: Preferential opacification of the thoracic aorta. No evidence of thoracic aortic aneurysm or dissection. Normal heart size. No pericardial effusion. Mediastinum/Nodes: No enlarged mediastinal, hilar, or axillary lymph nodes. Thyroid gland, trachea, and esophagus demonstrate no significant findings. Lungs/Pleura: Lungs are clear. No pleural effusion or pneumothorax. Musculoskeletal: No chest wall abnormality. No acute or significant osseous findings. Review of the MIP images confirms the above findings. CTA ABDOMEN AND PELVIS FINDINGS VASCULAR Aorta: Normal caliber aorta without aneurysm, dissection, vasculitis or significant stenosis. Celiac: Patent without evidence of aneurysm,  dissection, vasculitis or significant stenosis. SMA: Patent without evidence of aneurysm, dissection, vasculitis or significant stenosis. Renals: Both renal arteries are patent without evidence of aneurysm, dissection, vasculitis, fibromuscular dysplasia or significant stenosis. IMA: Patent without evidence of aneurysm, dissection, vasculitis or significant stenosis. Inflow: Patent without evidence of aneurysm, dissection, vasculitis or significant stenosis. Veins: No obvious venous abnormality within the limitations of this arterial phase study. Review of the MIP images confirms the above findings. NON-VASCULAR Hepatobiliary: Cholelithiasis is noted without biliary dilatation. Liver is unremarkable. Pancreas: Unremarkable. No pancreatic ductal dilatation or surrounding inflammatory changes. Spleen: Normal in size without focal abnormality. Adrenals/Urinary Tract: Adrenal glands are unremarkable. Kidneys are normal, without renal calculi, focal lesion, or hydronephrosis. Bladder is unremarkable. Stomach/Bowel: Stomach is within normal limits. Appendix appears normal. No evidence of bowel wall thickening, distention, or inflammatory changes. Lymphatic: No adenopathy is noted. Reproductive: Uterine fibroid is noted. No definite adnexal abnormality is noted. Other: No abdominal wall hernia or abnormality. No abdominopelvic ascites. Musculoskeletal: No acute or significant osseous findings. Review of the MIP images confirms the above findings. IMPRESSION: No evidence of thoracic or abdominal aortic dissection or aneurysm. Cholelithiasis. Uterine fibroid. No other abnormality seen. Electronically Signed   By: JMarijo ConceptionM.D.   On: 07/25/2022 16:39   CT Head Wo Contrast  Result Date: 07/25/2022 CLINICAL DATA:  On and off chest pain last several weeks. Numbness in the right arm over the past 2 days. EXAM: CT HEAD WITHOUT CONTRAST TECHNIQUE: Contiguous axial images were obtained from the base of the skull through  the vertex without intravenous contrast. RADIATION DOSE REDUCTION: This exam was performed according to the departmental dose-optimization program which includes automated exposure control, adjustment of the mA and/or kV according to patient size and/or use of iterative reconstruction technique. COMPARISON:  11/04/2009. FINDINGS: Brain: No evidence of acute infarction, hemorrhage, hydrocephalus, extra-axial collection or mass lesion/mass effect. Vascular: No hyperdense vessel or unexpected calcification. Skull: Normal. Negative for fracture or focal lesion. Sinuses/Orbits: Globes and orbits are unremarkable. Visualized sinuses are clear. Other: None. IMPRESSION: Normal unenhanced CT scan of the brain. Electronically Signed   By: DLajean ManesM.D.   On: 07/25/2022 16:30    Procedures Procedures    Medications Ordered in ED Medications  LORazepam (ATIVAN) tablet 0.5 mg (has no administration in time range)  LORazepam (ATIVAN) tablet 1 mg (has no administration in time range)  iohexol (OMNIPAQUE) 350 MG/ML injection 100 mL (100 mLs Intravenous Contrast Given 07/25/22 1609)    ED Course/ Medical Decision Making/ A&P  HEART Score: 3                Medical Decision Making Amount and/or Complexity of Data Reviewed Labs: ordered. Radiology: ordered.  Risk Prescription drug management.    57 year old female with medical history significant for thyroid disease, anemia, depression who presents to the emergency department with chest pain and left arm numbness.  The patient states that she has had on and off intermittent chest pain in the left side that has been ongoing for the past few weeks.  She describes it as a pulling sensation on the left.  Additionally, she states that she has had left arm numbness that have been intermittent over the last couple of weeks but has been persistent since Friday.  She is very worried about a stroke as her mother was recently diagnosed with a ruptured  aneurysm.  She denies any headaches.  She does have a history of migraine headaches and had endorsed headaches over the weekend described as tension type but currently is asymptomatic.  She denies any neck pain.  She denies any radicular symptoms.  She denies any weakness.  She denies any fevers or chills.  She endorses left-sided pressure that radiates to her back that is ongoing.  On arrival, the patient was afebrile, not tachycardic or tachypneic, mildly hypertensive BP 156/95, saturating on her percent on room air.  Differential diagnosis includes ACS, stable angina, less likely aortic dissection but considered, also considered lacunar CVA, peripheral neuropathy.  Workup initiated to include EKG revealed sinus rhythm, Q waves anteriorly noted, no acute ischemic changes noted.  His laboratory evaluation revealed troponins x 2 normal, CBC without a leukocytosis or anemia, BMP unremarkable, COVID-19 and influenza PCR testing collected and pending.  A CT head was performed given the patient's complaint of numbness which was unremarkable.  A CT chest abdomen pelvis was performed given the patient's complaint of left-sided chest pain radiating to her back which was without evidence of acute aortic syndrome or acute abnormality. IMPRESSION:  No evidence of thoracic or abdominal aortic dissection or aneurysm.    Cholelithiasis.    Uterine fibroid.    No other abnormality seen.   I discussed with the patient her concern for possible stroke.  I explained to her that her symptoms were fairly mild however could not fully rule out an isolated lacunar stroke that could result in a sensory deficit that has been ongoing since this Friday.  Given her intermittent symptoms of decreased sensation in her left arm over the last month, I am more concerned for a peripheral nerve syndrome or issue with degenerative disc disease in her cervical spine.  The patient did have reproducible left-sided chest wall tenderness on  exam consistent with likely musculoskeletal chest pain.  After discussion with the patient, will obtain MRIs of the brain and cervical spine to evaluate acute etiology.  Discussed with Dr., Who accepted the patient in ER to ER transfer to [facility] for MRI imaging.  If negative, the patient is likely stable for discharge and outpatient PCP follow-up.     Final Clinical Impression(s) / ED Diagnoses Final diagnoses:  Numbness of arm  Chest pain, unspecified type  Musculoskeletal chest pain    Rx / DC Orders ED Discharge Orders     None         Regan Lemming, MD 07/25/22 1916    Regan Lemming, MD 07/26/22 [phone]

## 2022-07-25 NOTE — ED Notes (Signed)
Pt is very anxious about the MRI and is having a hard time calming down, Ativan given times 2 thus far

## 2022-07-25 NOTE — Discharge Instructions (Addendum)
Your cardiac enzymes were normal and your EKG and CT imaging was reassuring.  Please go to the [facility] emergency department for MRI imaging to rule out stroke in the setting of your left arm numbness.

## 2022-07-25 NOTE — ED Triage Notes (Signed)
Pt states she has had chest pain off and on for the past couple of weeks pt describes it as a pulling pain from inside.  Pt states she has had numbness in her right arm for the past two days. Pt also endorse leg pain when she is laying down.

## 2022-07-26 ENCOUNTER — Emergency Department (HOSPITAL_COMMUNITY): Payer: Federal, State, Local not specified - PPO

## 2022-07-26 NOTE — ED Provider Notes (Signed)
Care assumed from Dr. Zenia Resides, patient with paresthesias of her left arm pending MRI of brain and C-spine.  Anticipate discharge if negative.  MRIs show no evidence of stroke or other acute process.  I have independently viewed the images, and agree with radiologist interpretation.  I have informed patient of these findings.  I have referred her to neurology for further outpatient workup.  Results for orders placed or performed during the hospital encounter of 03/50/09  Basic metabolic panel  Result Value Ref Range   Sodium 137 135 - 145 mmol/L   Potassium 3.5 3.5 - 5.1 mmol/L   Chloride 101 98 - 111 mmol/L   CO2 27 22 - 32 mmol/L   Glucose, Bld 113 (H) 70 - 99 mg/dL   BUN 15 6 - 20 mg/dL   Creatinine, Ser 0.80 0.44 - 1.00 mg/dL   Calcium 10.0 8.9 - 10.3 mg/dL   GFR, Estimated >60 >60 mL/min   Anion gap 9 5 - 15  CBC  Result Value Ref Range   WBC 6.8 4.0 - 10.5 K/uL   RBC 4.78 3.87 - 5.11 MIL/uL   Hemoglobin 13.8 12.0 - 15.0 g/dL   HCT 41.2 36.0 - 46.0 %   MCV 86.2 80.0 - 100.0 fL   MCH 28.9 26.0 - 34.0 pg   MCHC 33.5 30.0 - 36.0 g/dL   RDW 13.7 11.5 - 15.5 %   Platelets 350 150 - 400 K/uL   nRBC 0.0 0.0 - 0.2 %  I-stat Creatinine, ED  Result Value Ref Range   Creatinine, Ser 0.90 0.44 - 1.00 mg/dL  Troponin I (High Sensitivity)  Result Value Ref Range   Troponin I (High Sensitivity) <2 <18 ng/L  Troponin I (High Sensitivity)  Result Value Ref Range   Troponin I (High Sensitivity) <2 <18 ng/L   MR Cervical Spine Wo Contrast  Result Date: 07/26/2022 CLINICAL DATA:  Initial evaluation for left upper extremity numbness. EXAM: MRI CERVICAL SPINE WITHOUT CONTRAST TECHNIQUE: Multiplanar, multisequence MR imaging of the cervical spine was performed. No intravenous contrast was administered. COMPARISON:  None Available. FINDINGS: Alignment: Straightening of the normal cervical lordosis. No listhesis. Vertebrae: Vertebral body height maintained without acute or chronic fracture. Bone  marrow signal intensity within normal limits. No discrete or worrisome osseous lesions or abnormal marrow edema. Cord: Normal signal and morphology. Posterior Fossa, vertebral arteries, paraspinal tissues: Mild cerebellar tonsillar ectopia without frank Chiari malformation. Paraspinous soft tissues within normal limits. Normal flow voids seen within the vertebral arteries bilaterally. Disc levels: C2-C3: Unremarkable. C3-C4: Small central disc protrusion indents the ventral thecal sac, contacting the ventral cord. Mild spinal stenosis. Foramina remain patent. C4-C5: Tiny central disc protrusion minimally indents the ventral thecal sac (series 9, image 22). Minor uncovertebral spurring. No significant spinal stenosis. Foramina remain adequately patent. C5-C6: Shallow broad-based left paracentral disc protrusion (series 8, image 28). Partial effacement of the ventral thecal sac. No significant spinal stenosis or frank cord impingement. Superimposed uncovertebral spurring with resultant moderate left C6 foraminal narrowing. Right neural foramen remains patent. C6-C7: Disc desiccation without significant disc bulge. No canal or foraminal stenosis. C7-T1: Negative interspace. Mild facet hypertrophy. No spinal stenosis. Foramina remain patent. IMPRESSION: 1. Shallow left paracentral disc protrusion with uncovertebral spurring at C5-6 with resultant moderate left C6 foraminal stenosis. 2. Small central disc protrusion at C3-4 with resultant mild spinal stenosis. 3. Additional minor degenerative changes elsewhere within the cervical spine as detailed above. No other significant stenosis or neural impingement. Electronically Signed  By: Jeannine Boga M.D.   On: 07/26/2022 02:40   MR BRAIN WO CONTRAST  Result Date: 07/26/2022 CLINICAL DATA:  Initial evaluation for neuro deficit, stroke suspected. EXAM: MRI HEAD WITHOUT CONTRAST TECHNIQUE: Multiplanar, multiecho pulse sequences of the brain and surrounding  structures were obtained without intravenous contrast. COMPARISON:  Prior CT from 07/25/2022. FINDINGS: Brain: Cerebral volume within normal limits. No significant cerebral white matter disease. No evidence for acute or subacute ischemia. Gray-white matter differentiation maintained. No areas of chronic cortical infarction. No acute or chronic intracranial blood products. No mass lesion, midline shift or mass effect. No hydrocephalus or extra-axial fluid collection. Pituitary gland and suprasellar region within normal limits. Vascular: Major intracranial vascular flow voids are maintained. Skull and upper cervical spine: Mild cerebellar tonsillar ectopia without frank Chiari malformation. Bone marrow signal intensity within normal limits. No scalp soft tissue abnormality. Sinuses/Orbits: Globes orbital soft tissues within normal limits. Paranasal sinuses are clear. Small bilateral mastoid effusions noted, of doubtful significance. Visualized nasopharynx unremarkable. Other: None. IMPRESSION: Normal brain MRI.  No acute intracranial abnormality. Electronically Signed   By: Jeannine Boga M.D.   On: 07/26/2022 02:35   CT Angio Chest/Abd/Pel for Dissection W and/or Wo Contrast  Result Date: 07/25/2022 CLINICAL DATA:  Chest pain. EXAM: CT ANGIOGRAPHY CHEST, ABDOMEN AND PELVIS TECHNIQUE: Non-contrast CT of the chest was initially obtained. Multidetector CT imaging through the chest, abdomen and pelvis was performed using the standard protocol during bolus administration of intravenous contrast. Multiplanar reconstructed images and MIPs were obtained and reviewed to evaluate the vascular anatomy. RADIATION DOSE REDUCTION: This exam was performed according to the departmental dose-optimization program which includes automated exposure control, adjustment of the mA and/or kV according to patient size and/or use of iterative reconstruction technique. CONTRAST:  119m OMNIPAQUE IOHEXOL 350 MG/ML SOLN COMPARISON:   August 27, 2009. FINDINGS: CTA CHEST FINDINGS Cardiovascular: Preferential opacification of the thoracic aorta. No evidence of thoracic aortic aneurysm or dissection. Normal heart size. No pericardial effusion. Mediastinum/Nodes: No enlarged mediastinal, hilar, or axillary lymph nodes. Thyroid gland, trachea, and esophagus demonstrate no significant findings. Lungs/Pleura: Lungs are clear. No pleural effusion or pneumothorax. Musculoskeletal: No chest wall abnormality. No acute or significant osseous findings. Review of the MIP images confirms the above findings. CTA ABDOMEN AND PELVIS FINDINGS VASCULAR Aorta: Normal caliber aorta without aneurysm, dissection, vasculitis or significant stenosis. Celiac: Patent without evidence of aneurysm, dissection, vasculitis or significant stenosis. SMA: Patent without evidence of aneurysm, dissection, vasculitis or significant stenosis. Renals: Both renal arteries are patent without evidence of aneurysm, dissection, vasculitis, fibromuscular dysplasia or significant stenosis. IMA: Patent without evidence of aneurysm, dissection, vasculitis or significant stenosis. Inflow: Patent without evidence of aneurysm, dissection, vasculitis or significant stenosis. Veins: No obvious venous abnormality within the limitations of this arterial phase study. Review of the MIP images confirms the above findings. NON-VASCULAR Hepatobiliary: Cholelithiasis is noted without biliary dilatation. Liver is unremarkable. Pancreas: Unremarkable. No pancreatic ductal dilatation or surrounding inflammatory changes. Spleen: Normal in size without focal abnormality. Adrenals/Urinary Tract: Adrenal glands are unremarkable. Kidneys are normal, without renal calculi, focal lesion, or hydronephrosis. Bladder is unremarkable. Stomach/Bowel: Stomach is within normal limits. Appendix appears normal. No evidence of bowel wall thickening, distention, or inflammatory changes. Lymphatic: No adenopathy is noted.  Reproductive: Uterine fibroid is noted. No definite adnexal abnormality is noted. Other: No abdominal wall hernia or abnormality. No abdominopelvic ascites. Musculoskeletal: No acute or significant osseous findings. Review of the MIP images confirms the above findings. IMPRESSION: No  evidence of thoracic or abdominal aortic dissection or aneurysm. Cholelithiasis. Uterine fibroid. No other abnormality seen. Electronically Signed   By: Marijo Conception M.D.   On: 07/25/2022 16:39   CT Head Wo Contrast  Result Date: 07/25/2022 CLINICAL DATA:  On and off chest pain last several weeks. Numbness in the right arm over the past 2 days. EXAM: CT HEAD WITHOUT CONTRAST TECHNIQUE: Contiguous axial images were obtained from the base of the skull through the vertex without intravenous contrast. RADIATION DOSE REDUCTION: This exam was performed according to the departmental dose-optimization program which includes automated exposure control, adjustment of the mA and/or kV according to patient size and/or use of iterative reconstruction technique. COMPARISON:  11/04/2009. FINDINGS: Brain: No evidence of acute infarction, hemorrhage, hydrocephalus, extra-axial collection or mass lesion/mass effect. Vascular: No hyperdense vessel or unexpected calcification. Skull: Normal. Negative for fracture or focal lesion. Sinuses/Orbits: Globes and orbits are unremarkable. Visualized sinuses are clear. Other: None. IMPRESSION: Normal unenhanced CT scan of the brain. Electronically Signed   By: Lajean Manes M.D.   On: 50/41/3643 83:77      Delora Fuel, MD 93/96/88 [id number]

## 2022-07-26 NOTE — ED Notes (Signed)
Back to MRI at this time

## 2022-09-29 ENCOUNTER — Encounter: Payer: Self-pay | Admitting: Diagnostic Neuroimaging

## 2022-09-29 ENCOUNTER — Ambulatory Visit: Payer: Federal, State, Local not specified - PPO | Admitting: Diagnostic Neuroimaging

## 2023-04-19 ENCOUNTER — Other Ambulatory Visit: Payer: Self-pay | Admitting: Physician Assistant

## 2023-04-19 DIAGNOSIS — Z1231 Encounter for screening mammogram for malignant neoplasm of breast: Secondary | ICD-10-CM

## 2023-05-12 ENCOUNTER — Ambulatory Visit: Payer: Federal, State, Local not specified - PPO

## 2024-07-25 ENCOUNTER — Encounter: Payer: Self-pay | Admitting: Internal Medicine
